# Patient Record
Sex: Male | Born: 1950 | Race: White | Marital: Married | Smoking: Never smoker
Health system: Southern US, Community
[De-identification: ages and names within clinical notes are randomized; demographics above are authoritative.]

## PROBLEM LIST (undated history)

## (undated) DIAGNOSIS — C801 Malignant (primary) neoplasm, unspecified: Secondary | ICD-10-CM

## (undated) DIAGNOSIS — R112 Nausea with vomiting, unspecified: Secondary | ICD-10-CM

## (undated) DIAGNOSIS — K219 Gastro-esophageal reflux disease without esophagitis: Secondary | ICD-10-CM

## (undated) DIAGNOSIS — Z86718 Personal history of other venous thrombosis and embolism: Secondary | ICD-10-CM

## (undated) DIAGNOSIS — I4892 Unspecified atrial flutter: Secondary | ICD-10-CM

## (undated) DIAGNOSIS — Z9889 Other specified postprocedural states: Secondary | ICD-10-CM

## (undated) DIAGNOSIS — Z86711 Personal history of pulmonary embolism: Secondary | ICD-10-CM

## (undated) DIAGNOSIS — I77810 Thoracic aortic ectasia: Secondary | ICD-10-CM

## (undated) DIAGNOSIS — R519 Headache, unspecified: Secondary | ICD-10-CM

## (undated) DIAGNOSIS — I480 Paroxysmal atrial fibrillation: Secondary | ICD-10-CM

## (undated) DIAGNOSIS — I712 Thoracic aortic aneurysm, without rupture: Secondary | ICD-10-CM

## (undated) DIAGNOSIS — I351 Nonrheumatic aortic (valve) insufficiency: Secondary | ICD-10-CM

## (undated) DIAGNOSIS — F329 Major depressive disorder, single episode, unspecified: Secondary | ICD-10-CM

## (undated) DIAGNOSIS — R51 Headache: Secondary | ICD-10-CM

## (undated) DIAGNOSIS — D6859 Other primary thrombophilia: Secondary | ICD-10-CM

## (undated) DIAGNOSIS — IMO0001 Reserved for inherently not codable concepts without codable children: Secondary | ICD-10-CM

## (undated) DIAGNOSIS — Z0389 Encounter for observation for other suspected diseases and conditions ruled out: Secondary | ICD-10-CM

## (undated) DIAGNOSIS — F32A Depression, unspecified: Secondary | ICD-10-CM

## (undated) DIAGNOSIS — M1990 Unspecified osteoarthritis, unspecified site: Secondary | ICD-10-CM

## (undated) HISTORY — PX: HAND SURGERY: SHX662

## (undated) HISTORY — DX: Personal history of pulmonary embolism: Z86.711

## (undated) HISTORY — DX: Thoracic aortic ectasia: I77.810

## (undated) HISTORY — PX: KNEE SURGERY: SHX244

## (undated) HISTORY — DX: Nonrheumatic aortic (valve) insufficiency: I35.1

## (undated) HISTORY — DX: Reserved for inherently not codable concepts without codable children: IMO0001

## (undated) HISTORY — PX: ELBOW SURGERY: SHX618

## (undated) HISTORY — PX: SHOULDER SURGERY: SHX246

## (undated) HISTORY — DX: Other primary thrombophilia: D68.59

## (undated) HISTORY — DX: Paroxysmal atrial fibrillation: I48.0

## (undated) HISTORY — DX: Unspecified osteoarthritis, unspecified site: M19.90

## (undated) HISTORY — DX: Personal history of other venous thrombosis and embolism: Z86.718

## (undated) HISTORY — DX: Thoracic aortic aneurysm, without rupture: I71.2

## (undated) HISTORY — DX: Encounter for observation for other suspected diseases and conditions ruled out: Z03.89

## (undated) HISTORY — DX: Unspecified atrial flutter: I48.92

---

## 2015-03-10 DIAGNOSIS — I7121 Aneurysm of the ascending aorta, without rupture: Secondary | ICD-10-CM

## 2015-03-10 DIAGNOSIS — I712 Thoracic aortic aneurysm, without rupture: Secondary | ICD-10-CM

## 2015-03-10 DIAGNOSIS — I77810 Thoracic aortic ectasia: Secondary | ICD-10-CM

## 2015-03-10 HISTORY — DX: Thoracic aortic ectasia: I77.810

## 2015-03-10 HISTORY — DX: Thoracic aortic aneurysm, without rupture: I71.2

## 2015-03-10 HISTORY — DX: Aneurysm of the ascending aorta, without rupture: I71.21

## 2016-01-14 ENCOUNTER — Encounter: Payer: Self-pay | Admitting: Surgery

## 2016-01-14 ENCOUNTER — Encounter: Payer: Self-pay | Admitting: *Deleted

## 2016-01-14 ENCOUNTER — Institutional Professional Consult (permissible substitution) (INDEPENDENT_AMBULATORY_CARE_PROVIDER_SITE_OTHER): Payer: Medicare Other | Admitting: Surgery

## 2016-01-14 VITALS — BP 133/70 | HR 79 | Resp 16 | Ht 75.0 in | Wt 233.0 lb

## 2016-01-14 DIAGNOSIS — Z86711 Personal history of pulmonary embolism: Secondary | ICD-10-CM | POA: Insufficient documentation

## 2016-01-14 DIAGNOSIS — I351 Nonrheumatic aortic (valve) insufficiency: Secondary | ICD-10-CM | POA: Diagnosis not present

## 2016-01-14 DIAGNOSIS — I712 Thoracic aortic aneurysm, without rupture, unspecified: Secondary | ICD-10-CM

## 2016-01-14 DIAGNOSIS — I77810 Thoracic aortic ectasia: Secondary | ICD-10-CM | POA: Diagnosis not present

## 2016-01-14 DIAGNOSIS — M1990 Unspecified osteoarthritis, unspecified site: Secondary | ICD-10-CM | POA: Insufficient documentation

## 2016-01-18 ENCOUNTER — Encounter: Payer: Self-pay | Admitting: Surgery

## 2016-01-18 NOTE — Progress Notes (Signed)
Cardiothoracic Surgery Consultation  PCP is Lynita Lombard, MD Referring Provider is Christene Slates. Denier, MD  Chief Complaint  Patient presents with  . TAA    and AORTIC REGURG. per ECHO    HPI:  The patient is a 65 year old gentleman who presented with a pulmonary embolism in October 2016 thought to be related to DVT from sleeping in a recliner with his legs hanging down because he developed marked leg edema that suddenly resolved on the right side about the time he suffered the PE. Venous studies reportedly never showed a DVT. He was started on Xarelto. He had a sister who had a factory II abnormality and multiple PE's. He was treated with Xarelto for 6 months and it was stopped. 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  He had a CT of the chest in August 2016 that showed an ascending aortic aneurysm with a maximum diameter of 4.4 cm at the level of the right PA. He also had an echo in September 2016 that showed a dilated aortic root with a sinus of valsalva diameter of 4.9 cm with moderate AI. A repeat echo on 10/28/2015 showed no change in the aorta with a sinus diameter of 4.9 cm, an STJ diameter of 4.8 cm and an ascending aorta diameter of 4.2 cm. There was a trileaflet aortic valve with  moderate AI with a PHT of 430 ms. The LVEF was 55-60% with normal LV internal dimensions.   Past Medical History  Diagnosis Date  . Thoracic ascending aortic aneurysm ([place]) 03/2015    PER CT CHEST  . Dilated aortic root ([place]) 03/2015    PER CT CHEST  . Aortic regurgitation 9/23,2016 10/28/2015    MODERATE PER ECHOES  . Arthritis     WITH SPINAL INJURY  . Personal history of PE (pulmonary embolism)     BILATERAL    History reviewed. No pertinent past surgical history.  History reviewed. No pertinent family history.  Social History Social History  Substance Use Topics  . Smoking status: Never Smoker   . Smokeless tobacco: None  . Alcohol Use: 0.0  oz/week    0 Standard drinks or equivalent per week     Comment: 3-4 BEERS/WEEK    Current Outpatient Prescriptions  Medication Sig Dispense Refill  . aspirin EC 81 MG tablet Take 81 mg by mouth daily.    . baclofen (LIORESAL) 10 MG tablet Take 20 mg by mouth 2 (two) times daily.     Marland Kitchen ibuprofen (ADVIL,MOTRIN) 200 MG tablet Take 400 mg by mouth every 8 (eight) hours as needed.    Marland Kitchen omeprazole (PRILOSEC) 20 MG capsule Take 20 mg by mouth daily.    . traMADol (ULTRAM) 50 MG tablet Take 100 mg by mouth every 12 (twelve) hours as needed.     . cyclobenzaprine (FLEXERIL) 10 MG tablet Take 10 mg by mouth daily.    Marland Kitchen gabapentin (NEURONTIN) 300 MG capsule Take 300 mg by mouth 3 (three) times daily.     No current facility-administered medications for this visit.    Not on File  Review of Systems  Constitutional: Negative.   HENT: Negative.   Eyes:       Wears glasses, has floaters  Respiratory: Negative.  Negative for shortness of breath.   Cardiovascular: Negative.  Negative for chest pain and leg swelling.  Gastrointestinal: Negative.   Endocrine: Negative.   Genitourinary: Negative.   Musculoskeletal: Positive for back  pain, joint swelling and arthralgias.  Allergic/Immunologic: Negative.   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       Factor II mutation  Psychiatric/Behavioral: Positive for dysphoric mood.    BP 133/70 mmHg  Pulse 79  Resp 16  Ht 6\' 3"  (1.905 m)  Wt 233 lb (105.688 kg)  BMI 29.12 kg/m2  SpO2 98% Physical Exam  Constitutional: He is oriented to person, place, and time. He appears well-developed and well-nourished. No distress.  HENT:  Head: Normocephalic and atraumatic.  Mouth/Throat: Oropharynx is clear and moist.  Eyes: EOM are normal. Pupils are equal, round, and reactive to light.  Neck: Normal range of motion. Neck supple. No thyromegaly present.  Cardiovascular: Normal rate, regular  rhythm and intact distal pulses.   Murmur heard. 1/6 diastolic murmur along the RSB  Pulmonary/Chest: Effort normal and breath sounds normal. No respiratory distress.  Abdominal: Soft. Bowel sounds are normal. He exhibits no distension and no mass. There is no tenderness.  Musculoskeletal: Normal range of motion. He exhibits no edema or tenderness.  Lymphadenopathy:    He has no cervical adenopathy.  Neurological: He is alert and oriented to person, place, and time. He has normal strength. No cranial nerve deficit.  Skin: Skin is warm and dry.  Psychiatric: He has a normal mood and affect.     Diagnostic Tests:  Echo report from [facility] reviewed. He has the study on a disc but I could not get it to run on our system even with IT help.   Impression:  He has an aortic root and ascending aortic aneurysm with a sinus diameter of 4.9 cm and an STJ of 4.8 cm with an ascending aorta of 4.2 cm. There is a trileaflet aortic valve with moderate AI and normal LV function and dimensions. There was no significant change from the echo in September 2016. There is no indication to do surgery at this time. It is unknown how long his aorta has been this size. Surgery to replace the aorta is usually recommended at 5.5 cm or if there is a period of rapid enlargement considered to be 5 mm over one year. His AI is likely related to the aortic root aneurysm and will need to be followed since it may worsen over time. Indications for surgery would be severe symptomatic AI or LV dilation or reduction in LVEF. I reviewed the echo findings with him and his wife. We discussed the follow up needed and the indications for surgery. All of their questions were answered.  Plan:  I will see him back in 6 months for a CTA of the chest and will then plan to follow him at intervals to assess for enlargement of the aorta. He will continue to follow up with his cardiologist and will need periodic  echocardiograms to follow the aortic insufficiency. Gaye Pollack, MD [facility] [phone]

## 2016-06-14 ENCOUNTER — Other Ambulatory Visit: Payer: Self-pay | Admitting: Surgery

## 2016-06-14 DIAGNOSIS — I712 Thoracic aortic aneurysm, without rupture, unspecified: Secondary | ICD-10-CM

## 2016-07-21 ENCOUNTER — Encounter: Payer: Self-pay | Admitting: Surgery

## 2016-07-21 ENCOUNTER — Ambulatory Visit (INDEPENDENT_AMBULATORY_CARE_PROVIDER_SITE_OTHER): Payer: Medicare Other | Admitting: Surgery

## 2016-07-21 ENCOUNTER — Ambulatory Visit
Admission: RE | Admit: 2016-07-21 | Discharge: 2016-07-21 | Disposition: A | Discharge status: Home / Self Care | Payer: Medicare Other | Source: Ambulatory Visit | Attending: Surgery | Admitting: Surgery

## 2016-07-21 VITALS — BP 127/75 | HR 70 | Resp 16 | Ht 76.0 in | Wt 191.0 lb

## 2016-07-21 DIAGNOSIS — I712 Thoracic aortic aneurysm, without rupture, unspecified: Secondary | ICD-10-CM

## 2016-07-21 DIAGNOSIS — I351 Nonrheumatic aortic (valve) insufficiency: Secondary | ICD-10-CM

## 2016-07-21 DIAGNOSIS — I77810 Thoracic aortic ectasia: Secondary | ICD-10-CM | POA: Diagnosis not present

## 2016-07-21 MED ORDER — IOPAMIDOL (ISOVUE-370) INJECTION 76%: 75.0000 mL | Freq: Once | INTRAVENOUS | Status: DC | PRN

## 2016-07-22 ENCOUNTER — Other Ambulatory Visit: Payer: Self-pay | Admitting: *Deleted

## 2016-07-22 DIAGNOSIS — I351 Nonrheumatic aortic (valve) insufficiency: Secondary | ICD-10-CM

## 2016-07-22 DIAGNOSIS — I712 Thoracic aortic aneurysm, without rupture, unspecified: Secondary | ICD-10-CM

## 2016-07-23 ENCOUNTER — Telehealth: Payer: Self-pay | Admitting: Cardiovascular Disease

## 2016-07-23 NOTE — Telephone Encounter (Signed)
I spoke with pt and  scheduled him to see Dr. Angelena Form on 07/28/16 at 11:30

## 2016-07-23 NOTE — Telephone Encounter (Signed)
Pt returned this office call and would like an appt 10.- 2:30pm  eailer would be better if possible.    Please  Please give him a call back.

## 2016-07-25 ENCOUNTER — Encounter: Payer: Self-pay | Admitting: Surgery

## 2016-07-25 NOTE — Progress Notes (Signed)
HPI:  The patient returns today for follow up of an aortic root aneurysm with a sinus diameter of 4.9 cm, an STJ of 4.8 cm and an ascending diameter of 4.2 cm by echo done at [facility] in March 2017. He had moderate AI at that time with a PHT of 430 ms with a normal LV.  A prior CT of the chest in August 2016 had shown an ascending aortic aneurysm with a diameter of 4.4 cm at the level of the right PA. Since I saw him in June he has been doing well. He has had no chest pain or shortness of breath. He has lost a lot of weight by eating a strict, mostly vegetable diet and not eating any sweets. His main complaints are related to DJD of the spine and arthritis and not being able to take NSAIDS due to being on Xarelto. He had a rhythm monitor on for a week in October and had a few brief runs of atrial flutter and atrial fibrillation and was restarted on Xarelto when he saw his cardiologist, Dr. Boyd Kerbs, on Nov 1. 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 He had a 2D echo on 04/29/2016 that showed an aortic sinus diameter of 5.0 cm with an STJ of 3.7 cm and ascending aortic diameter of 4.3 cm. LV internal dimensions were within the normal range. EF was 50-55% with mild LVH and stage 1 diastolic dysfuction. The aortic valve was trileaflet with severe AI. The PHT was recorded as 435 ms. There was no MR. He had a nuclear stress test on 06/07/2016 that was negative for ischemia with an EF of 55%.  Current Outpatient Prescriptions  Medication Sig Dispense Refill  . acetaminophen (TYLENOL) 500 MG tablet Take 500 mg by mouth 3 (three) times daily.    . baclofen (LIORESAL) 10 MG tablet Take 10 mg by mouth 2 (two) times daily.     . cyclobenzaprine (FLEXERIL) 10 MG tablet Take 10 mg by mouth daily.    . fluticasone (FLONASE) 50 MCG/ACT nasal spray Place 2 sprays into both nostrils daily.    Marland Kitchen gabapentin  (NEURONTIN) 300 MG capsule Take 300 mg by mouth 3 (three) times daily.    . metoprolol tartrate (LOPRESSOR) 25 MG tablet Take 25 mg by mouth 2 (two) times daily.    Marland Kitchen omeprazole (PRILOSEC) 20 MG capsule Take 20 mg by mouth daily.    . rivaroxaban (XARELTO) 20 MG TABS tablet Take 20 mg by mouth daily with supper.    . traMADol (ULTRAM) 50 MG tablet Take 100 mg by mouth every 12 (twelve) hours as needed.      No current facility-administered medications for this visit.      Physical Exam: BP 127/75 (BP Location: Right Arm, Patient Position: Sitting, Cuff Size: Large)   Pulse 70   Resp 16   Ht 6\' 4"  (1.93 m)   Wt 191 lb (86.6 kg)   SpO2 99% Comment: ON RA  BMI 23.25 kg/m  He looks well but much thinner than when I last saw him. Cardiac exam shows a regular rate and rhythm with a 2/6 diastolic murmur along the RSB.  Lungs are clear There is no peripheral edema.   Diagnostic Exam:  CLINICAL DATA:  Thoracic aortic aneurysm without rupture.  EXAM: CT ANGIOGRAPHY CHEST WITH CONTRAST  TECHNIQUE: Multidetector CT imaging of  the chest was performed using the standard protocol during bolus administration of intravenous contrast. Multiplanar CT image reconstructions and MIPs were obtained to evaluate the vascular anatomy.  CONTRAST:  75 mL of Isovue 370 intravenously.  COMPARISON:  None.  FINDINGS: Cardiovascular: There is no evidence of thoracic aortic dissection. 4.1 cm ascending thoracic aortic aneurysm is noted. Aortic root is aneurysmal at 5.4 cm. Transverse aortic arch measures 3.2 cm. Proximal descending thoracic aorta measures 3.7 cm. Distal descending thoracic aorta measures 2.6 cm. Great vessels are widely patent without significant stenosis.  Mediastinum/Nodes: No enlarged mediastinal, hilar, or axillary lymph nodes. Thyroid gland, trachea, and esophagus demonstrate no significant findings.  Lungs/Pleura: Lungs are clear. No pleural effusion or  pneumothorax.  Upper Abdomen: No acute abnormality.  Musculoskeletal: No chest wall abnormality. No acute or significant osseous findings.  Review of the MIP images confirms the above findings.  IMPRESSION: Aortic root is aneurysmal at 5.4 cm. 4.1 cm ascending thoracic aortic aneurysm is noted. Recommend annual imaging followup by CTA or MRA. This recommendation follows 2010 ACCF/AHA/AATS/ACR/ASA/SCA/SCAI/SIR/STS/SVM Guidelines for the Diagnosis and Management of Patients with Thoracic Aortic Disease. Circulation. 2010; [id number].   Electronically Signed   By: Marijo Conception, M.D.   On: 07/21/2016 13:24   Impression:  Mr. K[place]ik has an aortic root and ascending aortic aneurysm with further enlargement of the sinus diameter on his CTA to 5.4 cm. He has moderate to severe AI by echo with a diastolic murmur on exam. I think this aneurysm and AI needs to be repaired to prevent an aortic dissection and progressive deterioration of LV function due to severe AI. I would plan to replace the aortic root and ascending aorta up to the innominate artery which is where the aorta decreases to more normal size of 3.2 cm. I don't think his aortic valve will be suitable for a valve-sparing root replacement given the size of the annulus and the degree of AI. The leaflets are usually elongated and fenestrated at the commissures and  long term valve competency is reduced. I discussed use of mechanical and bioprosthetic valves with him and his wife. He would need to be on life-long coumadin with a mechanical valve which he is not interested in. Given his age of 54 a bovine pericardial valve would be a good choice with a low risk of structural valve deterioration in his lifetime and no need for coumadin. He is in agreement with that. He is currently on Xarelto for possible brief episodes of atrial fibrillation and atrial flutter. If he has a prosthetic valve in place then Xarelto is not approved for  valvular atrial fibrillation at this time and he may need to be on Coumadin anyway, although there is no absolute indication for it as far as the valve is concerned. I would plan to place a clip on the left atrial appendage to decrease the risk of left atrial thrombus and that may allow him to avoid anticoagulation. I discussed the operative procedure with the patient and his wife including alternatives, benefits and risks; including but not limited to bleeding, blood transfusion, infection, stroke, myocardial infarction, structural valve deterioration, heart block requiring a permanent pacemaker, organ dysfunction, and death.  Kriste Basque understands and agrees to proceed. He will require a cardiac cath prior to surgery and would like to have that done at [facility] since his daughter-in-law, Vivien Stephan, is a PA for radiology here. We will arrange that as an outpatient in early January and I  would like to wait a week afterwards to do surgery. He will stop the Xarelto 5 days prior to cath and will stay off of it up to the time of surgery.   Plan:  We will schedule cardiac cath as an outpatient in January a week prior to surgery. He will think about the timing of surgery and call my nurse to schedule Bioprosthetic Bentall procedure with replacement of the ascending aortic aneurysm using circulatory arrest, clipping of the left atrial appendage.   Gaye Pollack, MD [facility] [phone]

## 2016-07-27 NOTE — Progress Notes (Signed)
Chief Complaint  Patient presents with  . New Patient (Initial Visit)    heart cath eval    History of Present Illness: 65 yo male with history of PE/DVT on Xarelto, Factor II mutation, ascending aorta aneurysm, moderate aortic insufficiency, paroxysmal atrial fibrillation/flutter who is here today as a new patient to discuss his upcoming cardiac catheterization. He has been seen in our CT surgery office by Dr. Cyndia Bent. Most recent echo September 2017 with normal LV systolic function, severe aortic valve insufficiency. Nuclear stress test October 2017 with no ischemia. His cardiologist is Dr. Fuller Plan  in [place], [place]. Dr. Mellody Memos is his cardiologist. Dr. Cyndia Bent is planning a Bentall procedure with replacement of the ascending aortic aneurysm, AV replacement and clipping of the left atrial appendage.   He has no complaints today. He has many questions about his cardiac cath. No chest pain or dyspnea. No LE edema.   Primary Care Physician: No primary care provider on file.  Dr. Fuller Plan [place], [place]) Cardiology: Dr. Mellody Memos [place])   Past Medical History:  Diagnosis Date  . Aortic regurgitation 9/23,2016 10/28/2015   MODERATE PER ECHOES  . Arthritis    WITH SPINAL INJURY  . Dilated aortic root ([place]) 03/2015   PER CT CHEST  . Personal history of PE (pulmonary embolism)    BILATERAL  . Thoracic ascending aortic aneurysm ([place]) 03/2015   PER CT CHEST    Past Surgical History:  Procedure Laterality Date  . ELBOW SURGERY    . HAND SURGERY    . KNEE SURGERY    . SHOULDER SURGERY      Current Outpatient Prescriptions  Medication Sig Dispense Refill  . acetaminophen (TYLENOL) 500 MG tablet Take 500 mg by mouth 3 (three) times daily.    . baclofen (LIORESAL) 10 MG tablet Take 10 mg by mouth 2 (two) times daily.     . cyclobenzaprine (FLEXERIL) 10 MG tablet Take 10 mg by mouth daily.    . fluticasone (FLONASE) 50 MCG/ACT nasal spray Place 2 sprays into both nostrils daily.    Marland Kitchen gabapentin  (NEURONTIN) 300 MG capsule Take 300 mg by mouth 3 (three) times daily.    . metoprolol tartrate (LOPRESSOR) 25 MG tablet Take 25 mg by mouth 2 (two) times daily.    Marland Kitchen omeprazole (PRILOSEC) 20 MG capsule Take 20 mg by mouth daily.    . rivaroxaban (XARELTO) 20 MG TABS tablet Take 20 mg by mouth daily with supper.    . traMADol (ULTRAM) 50 MG tablet Take 100 mg by mouth every 12 (twelve) hours as needed.      No current facility-administered medications for this visit.     Allergies:surgical tape  Social History   Social History  . Marital status: Unknown    Spouse name: N/A  . Number of children: N/A  . Years of education: N/A   Occupational History  . Not on file.   Social History Main Topics  . Smoking status: Never Smoker  . Smokeless tobacco: Never Used  . Alcohol use 0.0 oz/week     Comment: 3-4 BEERS/WEEK  . Drug use: No  . Sexual activity: Not on file   Other Topics Concern  . Not on file   Social History Narrative  . No narrative on file    Family History  Problem Relation Age of Onset  . CVA Father   . Deep vein thrombosis Sister     Review of Systems:  As stated in the HPI and  otherwise negative.   BP 122/60 (BP Location: Left Arm, Cuff Size: Normal)   Pulse (!) 59   Ht 6\' 4"  (1.93 m)   Wt 195 lb 12.8 oz (88.8 kg)   BMI 23.83 kg/m   Physical Examination: General: Well developed, well nourished, NAD  HEENT: OP clear, mucus membranes moist  SKIN: warm, dry. No rashes. Neuro: No focal deficits  Musculoskeletal: Muscle strength 5/5 all ext  Psychiatric: Mood and affect normal  Neck: No JVD, no carotid bruits, no thyromegaly, no lymphadenopathy.  Lungs:Clear bilaterally, no wheezes, rhonci, crackles Cardiovascular: Regular rate and rhythm. No murmurs, gallops or rubs. Abdomen:Soft. Bowel sounds present. Non-tender.  Extremities: No lower extremity edema. Pulses are 2 + in the bilateral DP/PT.  EKG:  EKG is ordered today. The ekg ordered today  demonstrates Sinus brady, rate 59 bpm. 1st degree AV block. LVH.    Wt Readings from Last 3 Encounters:  07/28/16 195 lb 12.8 oz (88.8 kg)  07/21/16 191 lb (86.6 kg)  01/14/16 233 lb (105.7 kg)     Other studies Reviewed: Additional studies/ records that were reviewed today include: . Review of the above records demonstrates:   Assessment and Plan:   1. Thoracic aortic aneurysm/Aortic insufficiency: Planning underway per DR. Bartle for Bentall procedure 08/20/15 at [facility]. He will need a right and left heart catheterization prior to his planned surgical procedure. I have reviewed the risks of the cardiac catheterization today. He is willing to proceed. Pre-cath labs today. Will arrange right and left cardiac cath at [facility] on 08/12/15 at 11:30am.   Current medicines are reviewed at length with the patient today.  The patient does not have concerns regarding medicines.  The following changes have been made:  no change  Labs/ tests ordered today include:   Orders Placed This Encounter  Procedures  . CBC  . INR/PT  . Basic Metabolic Panel (BMET)  . EKG 12-Lead    Disposition:   No follow up after cath in this office.    Signed, Lauree Chandler, MD 07/28/2016 1:43 PM    [facility] [place], [place], [place]  [address] Phone: [phone]; Fax: [phone]

## 2016-07-28 ENCOUNTER — Encounter: Payer: Self-pay | Admitting: Cardiovascular Disease

## 2016-07-28 ENCOUNTER — Encounter: Payer: Self-pay | Admitting: *Deleted

## 2016-07-28 ENCOUNTER — Ambulatory Visit (INDEPENDENT_AMBULATORY_CARE_PROVIDER_SITE_OTHER): Payer: Medicare Other | Admitting: Cardiovascular Disease

## 2016-07-28 VITALS — BP 122/60 | HR 59 | Ht 76.0 in | Wt 195.8 lb

## 2016-07-28 DIAGNOSIS — I351 Nonrheumatic aortic (valve) insufficiency: Secondary | ICD-10-CM | POA: Diagnosis not present

## 2016-07-28 DIAGNOSIS — I712 Thoracic aortic aneurysm, without rupture, unspecified: Secondary | ICD-10-CM

## 2016-07-28 LAB — BASIC METABOLIC PANEL
BUN: 13 mg/dL (ref 7–25)
CO2: 25 mmol/L (ref 20–31)
Calcium: 9.7 mg/dL (ref 8.6–10.3)
Chloride: 108 mmol/L (ref 98–110)
Creat: 0.91 mg/dL (ref 0.70–1.25)
GLUCOSE: 83 mg/dL (ref 65–99)
Potassium: 4.4 mmol/L (ref 3.5–5.3)
SODIUM: 142 mmol/L (ref 135–146)

## 2016-07-28 LAB — CBC
HEMATOCRIT: 44.2 % (ref 38.5–50.0)
Hemoglobin: 14.7 g/dL (ref 13.2–17.1)
MCH: 30.4 pg (ref 27.0–33.0)
MCHC: 33.3 g/dL (ref 32.0–36.0)
MCV: 91.3 fL (ref 80.0–100.0)
MPV: 11.6 fL (ref 7.5–12.5)
Platelets: 191 10*3/uL (ref 140–400)
RBC: 4.84 MIL/uL (ref 4.20–5.80)
RDW: 15.3 % — AB (ref 11.0–15.0)
WBC: 5.6 10*3/uL (ref 3.8–10.8)

## 2016-07-28 LAB — PROTIME-INR
INR: 1.3 — ABNORMAL HIGH
Prothrombin Time: 13.2 s — ABNORMAL HIGH (ref 9.0–11.5)

## 2016-07-28 NOTE — Patient Instructions (Signed)
Medication Instructions:  Your physician recommends that you continue on your current medications as directed. Please refer to the Current Medication list given to you today.   Labwork: Lab work to be done today--BMP, CBC, PT/INR  Testing/Procedures: Your physician has requested that you have a cardiac catheterization. Cardiac catheterization is used to diagnose and/or treat various heart conditions. Doctors may recommend this procedure for a number of different reasons. The most common reason is to evaluate chest pain. Chest pain can be a symptom of coronary artery disease (CAD), and cardiac catheterization can show whether plaque is narrowing or blocking your heart's arteries. This procedure is also used to evaluate the valves, as well as measure the blood flow and oxygen levels in different parts of your heart. For further information please visit [email or web address]. Please follow instruction sheet, as given. Scheduled for January 3,2018 Coronary Angiogram A coronary angiogram is an X-ray procedure that is used to examine the arteries in the heart. In this procedure, a dye (contrast dye) is injected through a long, thin tube (catheter). The catheter is inserted through the groin, wrist, or arm. The dye is injected into each artery, then X-rays are taken to show if there is a blockage in the arteries of the heart. This procedure can also show if you have valve disease or a disease of the aorta, and it can be used to check the overall function of your heart muscle. You may have a coronary angiogram if:  You are having chest pain, or other symptoms of angina, and you are at risk for heart disease.  You have an abnormal electrocardiogram (ECG) or stress test.  You have chest pain and heart failure.  You are having irregular heart rhythms.  You and your health care provider determine that the benefits of the test information outweigh the risks of the procedure. Let your health care provider know  about:  Any allergies you have, including allergies to contrast dye.  All medicines you are taking, including vitamins, herbs, eye drops, creams, and over-the-counter medicines.  Any problems you or family members have had with anesthetic medicines.  Any blood disorders you have.  Any surgeries you have had.  History of kidney problems or kidney failure.  Any medical conditions you have.  Whether you are pregnant or may be pregnant. What are the risks? Generally, this is a safe procedure. However, problems may occur, including:  Infection.  Allergic reaction to medicines or dyes that are used.  Bleeding from the access site or other locations.  Kidney injury, especially in people with impaired kidney function.  Stroke (rare).  Heart attack (rare).  Damage to other structures or organs. What happens before the procedure? Staying hydrated  Follow instructions from your health care provider about hydration, which may include:  Up to 2 hours before the procedure - you may continue to drink clear liquids, such as water, clear fruit juice, black coffee, and plain tea. Eating and drinking restrictions  Follow instructions from your health care provider about eating and drinking, which may include:  8 hours before the procedure - stop eating heavy meals or foods such as meat, fried foods, or fatty foods.  6 hours before the procedure - stop eating light meals or foods, such as toast or cereal.  2 hours before the procedure - stop drinking clear liquids. General instructions  Ask your health care provider about:  Changing or stopping your regular medicines. This is especially important if you are taking diabetes medicines or blood  thinners.  Taking medicines such as ibuprofen. These medicines can thin your blood. Do not take these medicines before your procedure if your health care provider instructs you not to, though aspirin may be recommended prior to coronary  angiograms.  Plan to have someone take you home from the hospital or clinic.  You may need to have blood tests or X-rays done. What happens during the procedure?  An IV tube will be inserted into one of your veins.  You will be given one or more of the following:  A medicine to help you relax (sedative).  A medicine to numb the area where the catheter will be inserted into an artery (local anesthetic).  To reduce your risk of infection:  Your health care team will wash or sanitize their hands.  Your skin will be washed with soap.  Hair may be removed from the area where the catheter will be inserted.  You will be connected to a continuous ECG monitor.  The catheter will be inserted into an artery. The location may be in your groin, in your wrist, or in the fold of your arm (near your elbow).  A type of X-ray (fluoroscopy) will be used to help guide the catheter to the opening of the blood vessel that is being examined.  A dye will be injected into the catheter, and X-rays will be taken. The dye will help to show where any narrowing or blockages are located in the heart arteries.  Tell your health care provider if you have any chest pain or trouble breathing during the procedure.  If blockages are found, your health care provider may perform another procedure, such as inserting a coronary stent. The procedure may vary among health care providers and hospitals. What happens after the procedure?  After the procedure, you will need to keep the area still for a few hours, or for as long as told by your health care provider. If the procedure is done through the groin, you will be instructed to not bend and not cross your legs.  The insertion site will be checked frequently.  The pulse in your foot or wrist will be checked frequently.  You may have additional blood tests, X-rays, and a test that records the electrical activity of your heart (ECG).  Do not drive for 24 hours if  you were given a sedative. Summary  A coronary angiogram is an X-ray procedure that is used to look into the arteries in the heart.  During the procedure, a dye (contrast dye) is injected through a long, thin tube (catheter). The catheter is inserted through the groin, wrist, or arm.  Tell your health care provider about any allergies you have, including allergies to contrast dye.  After the procedure, you will need to keep the area still for a few hours, or for as long as told by your health care provider. This information is not intended to replace advice given to you by your health care provider. Make sure you discuss any questions you have with your health care provider. Document Released: 01/30/2003 Document Revised: 05/07/2016 Document Reviewed: 05/07/2016 Elsevier Interactive Patient Education  2017 [place]:   Any Other Special Instructions Will Be Listed Below (If Applicable).     If you need a refill on your cardiac medications before your next appointment, please call your pharmacy.

## 2016-07-29 ENCOUNTER — Telehealth: Payer: Self-pay | Admitting: Cardiovascular Disease

## 2016-07-29 NOTE — Telephone Encounter (Signed)
Clinton Pearson is calling to inform you that he has spine damage and has trouble laying flat on his back . He is to have a cath on 08/11/16. Please call . Thanks

## 2016-07-29 NOTE — Telephone Encounter (Signed)
Spoke with patient and he is concerned about having to lay flat on his back for his heart cath. Patient said he could lay back some but not completely flat. Nurse advised patient that he would be sedated for the cath which would help with any discomfort. Patient advised that his provider would be notified of his concerns and he would be contacted with a a response afterwards. Patient is aware that he may not hear back from our office until the first week of January 2018.

## 2016-08-05 NOTE — Telephone Encounter (Signed)
Pat, Can you let him know that we can sedate him for the cath and this should be ok. Thanks, chris

## 2016-08-05 NOTE — Telephone Encounter (Signed)
I spoke with pt and gave him information from Dr. McAlhany 

## 2016-08-11 ENCOUNTER — Ambulatory Visit (HOSPITAL_COMMUNITY)
Admission: RE | Admit: 2016-08-11 | Discharge: 2016-08-11 | Disposition: A | Discharge status: Home / Self Care | Payer: Medicare Other | Source: Ambulatory Visit | Attending: Cardiovascular Disease | Admitting: Cardiovascular Disease

## 2016-08-11 ENCOUNTER — Encounter (HOSPITAL_COMMUNITY)
Admission: RE | Disposition: A | Discharge status: Home / Self Care | Payer: Self-pay | Source: Ambulatory Visit | Attending: Cardiovascular Disease

## 2016-08-11 ENCOUNTER — Encounter (HOSPITAL_COMMUNITY): Payer: Self-pay | Admitting: Cardiovascular Disease

## 2016-08-11 DIAGNOSIS — I4892 Unspecified atrial flutter: Secondary | ICD-10-CM | POA: Insufficient documentation

## 2016-08-11 DIAGNOSIS — Z79899 Other long term (current) drug therapy: Secondary | ICD-10-CM | POA: Insufficient documentation

## 2016-08-11 DIAGNOSIS — I82509 Chronic embolism and thrombosis of unspecified deep veins of unspecified lower extremity: Secondary | ICD-10-CM | POA: Insufficient documentation

## 2016-08-11 DIAGNOSIS — I2782 Chronic pulmonary embolism: Secondary | ICD-10-CM | POA: Insufficient documentation

## 2016-08-11 DIAGNOSIS — Z7901 Long term (current) use of anticoagulants: Secondary | ICD-10-CM | POA: Diagnosis not present

## 2016-08-11 DIAGNOSIS — I712 Thoracic aortic aneurysm, without rupture, unspecified: Secondary | ICD-10-CM

## 2016-08-11 DIAGNOSIS — I480 Paroxysmal atrial fibrillation: Secondary | ICD-10-CM | POA: Insufficient documentation

## 2016-08-11 DIAGNOSIS — I351 Nonrheumatic aortic (valve) insufficiency: Secondary | ICD-10-CM | POA: Diagnosis not present

## 2016-08-11 HISTORY — PX: CARDIAC CATHETERIZATION: SHX172

## 2016-08-11 SURGERY — RIGHT/LEFT HEART CATH AND CORONARY ANGIOGRAPHY

## 2016-08-11 MED ORDER — SODIUM CHLORIDE 0.9% FLUSH: 3.0000 mL | INTRAVENOUS | Status: DC | PRN

## 2016-08-11 MED ORDER — LIDOCAINE HCL (PF) 1 % IJ SOLN
INTRAMUSCULAR | Status: DC | PRN
  Administered 2016-08-11 (×2): 2 mL

## 2016-08-11 MED ORDER — SODIUM CHLORIDE 0.9% FLUSH
3.0000 mL | Freq: Two times a day (BID) | INTRAVENOUS | Status: DC
Start: 2016-08-11 — End: 2016-08-11

## 2016-08-11 MED ORDER — MIDAZOLAM HCL 2 MG/2ML IJ SOLN
INTRAMUSCULAR | Status: AC
  Filled 2016-08-11: qty 2

## 2016-08-11 MED ORDER — ASPIRIN 81 MG PO CHEW: 81.0000 mg | CHEWABLE_TABLET | ORAL | Status: DC

## 2016-08-11 MED ORDER — ASPIRIN 81 MG PO CHEW
CHEWABLE_TABLET | ORAL | Status: DC | PRN
  Administered 2016-08-11: 324 mg via ORAL

## 2016-08-11 MED ORDER — SODIUM CHLORIDE 0.9 % IV SOLN: 250.0000 mL | INTRAVENOUS | Status: DC | PRN

## 2016-08-11 MED ORDER — FENTANYL CITRATE (PF) 100 MCG/2ML IJ SOLN
INTRAMUSCULAR | Status: AC
  Filled 2016-08-11: qty 2

## 2016-08-11 MED ORDER — SODIUM CHLORIDE 0.9 % IV SOLN
INTRAVENOUS | Status: AC
  Administered 2016-08-11: 10:00:00 via INTRAVENOUS

## 2016-08-11 MED ORDER — ASPIRIN 81 MG PO CHEW
CHEWABLE_TABLET | ORAL | Status: AC
  Filled 2016-08-11: qty 4

## 2016-08-11 MED ORDER — LIDOCAINE HCL (PF) 1 % IJ SOLN
INTRAMUSCULAR | Status: AC
  Filled 2016-08-11: qty 30

## 2016-08-11 MED ORDER — MIDAZOLAM HCL 2 MG/2ML IJ SOLN
INTRAMUSCULAR | Status: DC | PRN
  Administered 2016-08-11: 2 mg via INTRAVENOUS

## 2016-08-11 MED ORDER — FENTANYL CITRATE (PF) 100 MCG/2ML IJ SOLN
INTRAMUSCULAR | Status: DC | PRN
  Administered 2016-08-11 (×2): 50 ug via INTRAVENOUS

## 2016-08-11 MED ORDER — SODIUM CHLORIDE 0.9% FLUSH: 3.0000 mL | Freq: Two times a day (BID) | INTRAVENOUS | Status: DC

## 2016-08-11 MED ORDER — VERAPAMIL HCL 2.5 MG/ML IV SOLN
INTRAVENOUS | Status: AC
  Filled 2016-08-11: qty 2

## 2016-08-11 MED ORDER — HEPARIN (PORCINE) IN NACL 2-0.9 UNIT/ML-% IJ SOLN
INTRAMUSCULAR | Status: AC
  Filled 2016-08-11: qty 1000

## 2016-08-11 MED ORDER — HEPARIN SODIUM (PORCINE) 1000 UNIT/ML IJ SOLN
INTRAMUSCULAR | Status: AC
Start: 2016-08-11 — End: 2016-08-11
  Filled 2016-08-11: qty 1

## 2016-08-11 MED ORDER — HEPARIN SODIUM (PORCINE) 1000 UNIT/ML IJ SOLN
INTRAMUSCULAR | Status: DC | PRN
  Administered 2016-08-11: 4500 [IU] via INTRAVENOUS

## 2016-08-11 MED ORDER — VERAPAMIL HCL 2.5 MG/ML IV SOLN
INTRAVENOUS | Status: DC | PRN
  Administered 2016-08-11: 10 mL via INTRA_ARTERIAL

## 2016-08-11 MED ORDER — ASPIRIN 81 MG PO CHEW
CHEWABLE_TABLET | ORAL | Status: AC
  Filled 2016-08-11: qty 1

## 2016-08-11 MED ORDER — HEPARIN (PORCINE) IN NACL 2-0.9 UNIT/ML-% IJ SOLN
INTRAMUSCULAR | Status: DC | PRN
  Administered 2016-08-11: 1000 mL

## 2016-08-11 MED ORDER — IOPAMIDOL (ISOVUE-370) INJECTION 76%
INTRAVENOUS | Status: DC | PRN
  Administered 2016-08-11: 60 mL via INTRA_ARTERIAL

## 2016-08-11 MED ORDER — SODIUM CHLORIDE 0.9 % IV SOLN: INTRAVENOUS | Status: AC

## 2016-08-11 SURGICAL SUPPLY — 15 items
CATH BALLN WEDGE 5F 110CM (CATHETERS) ×2 IMPLANT
CATH INFINITI 5 FR JL3.5 (CATHETERS) ×2 IMPLANT
CATH INFINITI JR4 5F (CATHETERS) ×2 IMPLANT
DEVICE RAD COMP TR BAND LRG (VASCULAR PRODUCTS) ×2 IMPLANT
GLIDESHEATH SLEND SS 6F .021 (SHEATH) ×2 IMPLANT
GUIDEWIRE .025 260CM (WIRE) ×2 IMPLANT
GUIDEWIRE ANGLED .035X260CM (WIRE) IMPLANT
GUIDEWIRE INQWIRE 1.5J.035X260 (WIRE) IMPLANT
INQWIRE 1.5J .035X260CM (WIRE) ×3
KIT HEART LEFT (KITS) ×3 IMPLANT
PACK CARDIAC CATHETERIZATION (CUSTOM PROCEDURE TRAY) ×3 IMPLANT
SHEATH FAST CATH BRACH 5F 5CM (SHEATH) ×2 IMPLANT
SYR MEDRAD MARK V 150ML (SYRINGE) ×3 IMPLANT
TRANSDUCER W/STOPCOCK (MISCELLANEOUS) ×4 IMPLANT
TUBING CIL FLEX 10 FLL-RA (TUBING) ×3 IMPLANT

## 2016-08-11 NOTE — Discharge Instructions (Signed)

## 2016-08-11 NOTE — H&P (View-Only) (Signed)
Chief Complaint  Patient presents with  . New Patient (Initial Visit)    heart cath eval    History of Present Illness: 66 yo male with history of PE/DVT on Xarelto, Factor II mutation, ascending aorta aneurysm, moderate aortic insufficiency, paroxysmal atrial fibrillation/flutter who is here today as a new patient to discuss his upcoming cardiac catheterization. He has been seen in our CT surgery office by Dr. Cyndia Bent. Most recent echo September 2017 with normal LV systolic function, severe aortic valve insufficiency. Nuclear stress test October 2017 with no ischemia. His cardiologist is Dr. Fuller Plan  in [place], [place]. Dr. Mellody Memos is his cardiologist. Dr. Cyndia Bent is planning a Bentall procedure with replacement of the ascending aortic aneurysm, AV replacement and clipping of the left atrial appendage.   He has no complaints today. He has many questions about his cardiac cath. No chest pain or dyspnea. No LE edema.   Primary Care Physician: No primary care provider on file.  Dr. Fuller Plan [place], [place]) Cardiology: Dr. Mellody Memos [place])   Past Medical History:  Diagnosis Date  . Aortic regurgitation 9/23,2016 10/28/2015   MODERATE PER ECHOES  . Arthritis    WITH SPINAL INJURY  . Dilated aortic root ([place]) 03/2015   PER CT CHEST  . Personal history of PE (pulmonary embolism)    BILATERAL  . Thoracic ascending aortic aneurysm ([place]) 03/2015   PER CT CHEST    Past Surgical History:  Procedure Laterality Date  . ELBOW SURGERY    . HAND SURGERY    . KNEE SURGERY    . SHOULDER SURGERY      Current Outpatient Prescriptions  Medication Sig Dispense Refill  . acetaminophen (TYLENOL) 500 MG tablet Take 500 mg by mouth 3 (three) times daily.    . baclofen (LIORESAL) 10 MG tablet Take 10 mg by mouth 2 (two) times daily.     . cyclobenzaprine (FLEXERIL) 10 MG tablet Take 10 mg by mouth daily.    . fluticasone (FLONASE) 50 MCG/ACT nasal spray Place 2 sprays into both nostrils daily.    Marland Kitchen gabapentin  (NEURONTIN) 300 MG capsule Take 300 mg by mouth 3 (three) times daily.    . metoprolol tartrate (LOPRESSOR) 25 MG tablet Take 25 mg by mouth 2 (two) times daily.    Marland Kitchen omeprazole (PRILOSEC) 20 MG capsule Take 20 mg by mouth daily.    . rivaroxaban (XARELTO) 20 MG TABS tablet Take 20 mg by mouth daily with supper.    . traMADol (ULTRAM) 50 MG tablet Take 100 mg by mouth every 12 (twelve) hours as needed.      No current facility-administered medications for this visit.     Allergies:surgical tape  Social History   Social History  . Marital status: Unknown    Spouse name: N/A  . Number of children: N/A  . Years of education: N/A   Occupational History  . Not on file.   Social History Main Topics  . Smoking status: Never Smoker  . Smokeless tobacco: Never Used  . Alcohol use 0.0 oz/week     Comment: 3-4 BEERS/WEEK  . Drug use: No  . Sexual activity: Not on file   Other Topics Concern  . Not on file   Social History Narrative  . No narrative on file    Family History  Problem Relation Age of Onset  . CVA Father   . Deep vein thrombosis Sister     Review of Systems:  As stated in the HPI and  otherwise negative.   BP 122/60 (BP Location: Left Arm, Cuff Size: Normal)   Pulse (!) 59   Ht 6\' 4"  (1.93 m)   Wt 195 lb 12.8 oz (88.8 kg)   BMI 23.83 kg/m   Physical Examination: General: Well developed, well nourished, NAD  HEENT: OP clear, mucus membranes moist  SKIN: warm, dry. No rashes. Neuro: No focal deficits  Musculoskeletal: Muscle strength 5/5 all ext  Psychiatric: Mood and affect normal  Neck: No JVD, no carotid bruits, no thyromegaly, no lymphadenopathy.  Lungs:Clear bilaterally, no wheezes, rhonci, crackles Cardiovascular: Regular rate and rhythm. No murmurs, gallops or rubs. Abdomen:Soft. Bowel sounds present. Non-tender.  Extremities: No lower extremity edema. Pulses are 2 + in the bilateral DP/PT.  EKG:  EKG is ordered today. The ekg ordered today  demonstrates Sinus brady, rate 59 bpm. 1st degree AV block. LVH.    Wt Readings from Last 3 Encounters:  07/28/16 195 lb 12.8 oz (88.8 kg)  07/21/16 191 lb (86.6 kg)  01/14/16 233 lb (105.7 kg)     Other studies Reviewed: Additional studies/ records that were reviewed today include: . Review of the above records demonstrates:   Assessment and Plan:   1. Thoracic aortic aneurysm/Aortic insufficiency: Planning underway per DR. Bartle for Bentall procedure 08/20/15 at [facility]. He will need a right and left heart catheterization prior to his planned surgical procedure. I have reviewed the risks of the cardiac catheterization today. He is willing to proceed. Pre-cath labs today. Will arrange right and left cardiac cath at [facility] on 08/12/15 at 11:30am.   Current medicines are reviewed at length with the patient today.  The patient does not have concerns regarding medicines.  The following changes have been made:  no change  Labs/ tests ordered today include:   Orders Placed This Encounter  Procedures  . CBC  . INR/PT  . Basic Metabolic Panel (BMET)  . EKG 12-Lead    Disposition:   No follow up after cath in this office.    Signed, Lauree Chandler, MD 07/28/2016 1:43 PM    [facility] [place], [place], [place]  [address] Phone: [phone]; Fax: [phone]

## 2016-08-11 NOTE — Interval H&P Note (Signed)
History and Physical Interval Note:  08/11/2016 10:27 AM  Clinton Pearson  has presented today for cardiac cath with the diagnosis of thoracic aortic aneurysm, AI, pre-op  The various methods of treatment have been discussed with the patient and family. After consideration of risks, benefits and other options for treatment, the patient has consented to  Procedure(s): Right/Left Heart Cath and Coronary Angiography (N/A) as a surgical intervention .  The patient's history has been reviewed, patient examined, no change in status, stable for surgery.  I have reviewed the patient's chart and labs.  Questions were answered to the patient's satisfaction.     Lauree Chandler

## 2016-08-11 NOTE — Progress Notes (Signed)
Site area: Right brachial a 5 french venous sheath was removed  Site Prior to Removal:  Level 0  Pressure Applied For 15 MINUTES    Bedrest Beginning at  1200p  Manual:   Yes.    Patient Status During Pull:  stable  Post Pull Groin Site:  Level 0  Post Pull Instructions Given:  Yes.    Post Pull Pulses Present:  Yes.    Dressing Applied:  Yes.    Comments:  VS remain stable during sheath pull

## 2016-08-12 LAB — POCT I-STAT 3, VENOUS BLOOD GAS (G3P V)
ACID-BASE DEFICIT: 3 mmol/L — AB (ref 0.0–2.0)
Bicarbonate: 22.3 mmol/L (ref 20.0–28.0)
O2 SAT: 76 %
PCO2 VEN: 38.8 mmHg — AB (ref 44.0–60.0)
TCO2: 23 mmol/L (ref 0–100)
pH, Ven: 7.366 (ref 7.250–7.430)
pO2, Ven: 42 mmHg (ref 32.0–45.0)

## 2016-08-12 LAB — POCT I-STAT 3, ART BLOOD GAS (G3+)
Acid-base deficit: 2 mmol/L (ref 0.0–2.0)
Bicarbonate: 22.2 mmol/L (ref 20.0–28.0)
O2 Saturation: 99 %
TCO2: 23 mmol/L (ref 0–100)
pCO2 arterial: 37 mmHg (ref 32.0–48.0)
pH, Arterial: 7.385 (ref 7.350–7.450)
pO2, Arterial: 140 mmHg — ABNORMAL HIGH (ref 83.0–108.0)

## 2016-08-17 ENCOUNTER — Ambulatory Visit (HOSPITAL_COMMUNITY)
Admission: RE | Admit: 2016-08-17 | Discharge: 2016-08-17 | Disposition: A | Discharge status: Home / Self Care | Payer: Medicare Other | Source: Ambulatory Visit | Attending: Surgery | Admitting: Surgery

## 2016-08-17 ENCOUNTER — Encounter (HOSPITAL_COMMUNITY)
Admission: RE | Admit: 2016-08-17 | Discharge: 2016-08-17 | Disposition: A | Discharge status: Home / Self Care | Payer: Medicare Other | Source: Ambulatory Visit | Attending: Surgery | Admitting: Surgery

## 2016-08-17 ENCOUNTER — Ambulatory Visit (HOSPITAL_BASED_OUTPATIENT_CLINIC_OR_DEPARTMENT_OTHER)
Admission: RE | Admit: 2016-08-17 | Discharge: 2016-08-17 | Disposition: A | Discharge status: Home / Self Care | Payer: Medicare Other | Source: Ambulatory Visit | Attending: Surgery | Admitting: Surgery

## 2016-08-17 ENCOUNTER — Encounter (HOSPITAL_COMMUNITY): Payer: Self-pay

## 2016-08-17 ENCOUNTER — Ambulatory Visit (HOSPITAL_COMMUNITY): Admission: RE | Admit: 2016-08-17 | Payer: Medicare Other | Source: Ambulatory Visit

## 2016-08-17 DIAGNOSIS — I351 Nonrheumatic aortic (valve) insufficiency: Secondary | ICD-10-CM

## 2016-08-17 DIAGNOSIS — Z01810 Encounter for preprocedural cardiovascular examination: Secondary | ICD-10-CM | POA: Insufficient documentation

## 2016-08-17 DIAGNOSIS — J449 Chronic obstructive pulmonary disease, unspecified: Secondary | ICD-10-CM | POA: Insufficient documentation

## 2016-08-17 DIAGNOSIS — I712 Thoracic aortic aneurysm, without rupture, unspecified: Secondary | ICD-10-CM

## 2016-08-17 DIAGNOSIS — R9431 Abnormal electrocardiogram [ECG] [EKG]: Secondary | ICD-10-CM | POA: Insufficient documentation

## 2016-08-17 DIAGNOSIS — I440 Atrioventricular block, first degree: Secondary | ICD-10-CM

## 2016-08-17 DIAGNOSIS — Z01818 Encounter for other preprocedural examination: Secondary | ICD-10-CM | POA: Insufficient documentation

## 2016-08-17 DIAGNOSIS — R942 Abnormal results of pulmonary function studies: Secondary | ICD-10-CM | POA: Insufficient documentation

## 2016-08-17 HISTORY — DX: Other specified postprocedural states: Z98.890

## 2016-08-17 HISTORY — DX: Headache, unspecified: R51.9

## 2016-08-17 HISTORY — DX: Nausea with vomiting, unspecified: R11.2

## 2016-08-17 HISTORY — DX: Headache: R51

## 2016-08-17 HISTORY — DX: Major depressive disorder, single episode, unspecified: F32.9

## 2016-08-17 HISTORY — DX: Gastro-esophageal reflux disease without esophagitis: K21.9

## 2016-08-17 HISTORY — DX: Depression, unspecified: F32.A

## 2016-08-17 HISTORY — DX: Malignant (primary) neoplasm, unspecified: C80.1

## 2016-08-17 LAB — BLOOD GAS, ARTERIAL
ACID-BASE DEFICIT: 2.5 mmol/L — AB (ref 0.0–2.0)
Bicarbonate: 20.8 mmol/L (ref 20.0–28.0)
Drawn by: 470591
FIO2: 0.21
O2 Saturation: 94 %
PH ART: 7.453 — AB (ref 7.350–7.450)
PO2 ART: 84.6 mmHg (ref 83.0–108.0)
Patient temperature: 98.6
pCO2 arterial: 30.2 mmHg — ABNORMAL LOW (ref 32.0–48.0)

## 2016-08-17 LAB — VAS US DOPPLER PRE CABG
LCCAPDIAS: 14 cm/s
LCCAPSYS: 86 cm/s
LEFT ECA DIAS: -11 cm/s
LEFT VERTEBRAL DIAS: 6 cm/s
LICAPDIAS: -14 cm/s
LICAPSYS: -75 cm/s
Left CCA dist dias: -15 cm/s
Left CCA dist sys: -64 cm/s
Left ICA dist dias: -22 cm/s
Left ICA dist sys: -74 cm/s
RCCAPDIAS: 9 cm/s
RCCAPSYS: 83 cm/s
RIGHT ECA DIAS: -10 cm/s
RIGHT VERTEBRAL DIAS: 6 cm/s
Right cca dist sys: -74 cm/s

## 2016-08-17 LAB — COMPREHENSIVE METABOLIC PANEL
ALK PHOS: 46 U/L (ref 38–126)
ALT: 23 U/L (ref 17–63)
AST: 25 U/L (ref 15–41)
Albumin: 3.9 g/dL (ref 3.5–5.0)
Anion gap: 9 (ref 5–15)
BUN: 13 mg/dL (ref 6–20)
CALCIUM: 9.4 mg/dL (ref 8.9–10.3)
CO2: 17 mmol/L — ABNORMAL LOW (ref 22–32)
CREATININE: 0.84 mg/dL (ref 0.61–1.24)
Chloride: 113 mmol/L — ABNORMAL HIGH (ref 101–111)
Glucose, Bld: 95 mg/dL (ref 65–99)
Potassium: 4 mmol/L (ref 3.5–5.1)
Sodium: 139 mmol/L (ref 135–145)
Total Bilirubin: 0.6 mg/dL (ref 0.3–1.2)
Total Protein: 6.5 g/dL (ref 6.5–8.1)

## 2016-08-17 LAB — PULMONARY FUNCTION TEST
DL/VA % PRED: 71 %
DL/VA: 3.47 ml/min/mmHg/L
DLCO unc % pred: 68 %
DLCO unc: 26.65 ml/min/mmHg
FEF 25-75 POST: 2.71 L/s
FEF 25-75 Pre: 1.99 L/sec
FEF2575-%CHANGE-POST: 36 %
FEF2575-%PRED-POST: 87 %
FEF2575-%PRED-PRE: 64 %
FEV1-%Change-Post: 9 %
FEV1-%Pred-Post: 89 %
FEV1-%Pred-Pre: 82 %
FEV1-PRE: 3.3 L
FEV1-Post: 3.6 L
FEV1FVC-%CHANGE-POST: 11 %
FEV1FVC-%Pred-Pre: 93 %
FEV6-%Change-Post: 1 %
FEV6-%PRED-PRE: 90 %
FEV6-%Pred-Post: 90 %
FEV6-PRE: 4.62 L
FEV6-Post: 4.67 L
FEV6FVC-%Change-Post: 1 %
FEV6FVC-%Pred-Post: 105 %
FEV6FVC-%Pred-Pre: 104 %
FVC-%CHANGE-POST: -1 %
FVC-%PRED-POST: 87 %
FVC-%PRED-PRE: 88 %
FVC-POST: 4.69 L
FVC-PRE: 4.78 L
POST FEV6/FVC RATIO: 100 %
PRE FEV1/FVC RATIO: 69 %
Post FEV1/FVC ratio: 77 %
Pre FEV6/FVC Ratio: 99 %
RV % pred: 136 %
RV: 3.64 L
TLC % pred: 107 %
TLC: 8.61 L

## 2016-08-17 LAB — CBC
HCT: 40.3 % (ref 39.0–52.0)
HEMOGLOBIN: 13.7 g/dL (ref 13.0–17.0)
MCH: 30.6 pg (ref 26.0–34.0)
MCHC: 34 g/dL (ref 30.0–36.0)
MCV: 90 fL (ref 78.0–100.0)
PLATELETS: 155 10*3/uL (ref 150–400)
RBC: 4.48 MIL/uL (ref 4.22–5.81)
RDW: 14.9 % (ref 11.5–15.5)
WBC: 4.7 10*3/uL (ref 4.0–10.5)

## 2016-08-17 LAB — ABO/RH: ABO/RH(D): B NEG

## 2016-08-17 LAB — TYPE AND SCREEN
ABO/RH(D): B NEG
Antibody Screen: NEGATIVE

## 2016-08-17 LAB — URINALYSIS, ROUTINE W REFLEX MICROSCOPIC
Bilirubin Urine: NEGATIVE
GLUCOSE, UA: NEGATIVE mg/dL
Hgb urine dipstick: NEGATIVE
Ketones, ur: NEGATIVE mg/dL
LEUKOCYTES UA: NEGATIVE
Nitrite: NEGATIVE
PH: 6 (ref 5.0–8.0)
Protein, ur: NEGATIVE mg/dL
Specific Gravity, Urine: 1.008 (ref 1.005–1.030)

## 2016-08-17 LAB — APTT: APTT: 30 s (ref 24–36)

## 2016-08-17 LAB — PROTIME-INR
INR: 0.97
PROTHROMBIN TIME: 12.8 s (ref 11.4–15.2)

## 2016-08-17 LAB — SURGICAL PCR SCREEN
MRSA, PCR: NEGATIVE
Staphylococcus aureus: NEGATIVE

## 2016-08-17 MED ORDER — ALBUTEROL SULFATE (2.5 MG/3ML) 0.083% IN NEBU
2.5000 mg | INHALATION_SOLUTION | Freq: Once | RESPIRATORY_TRACT | Status: AC
Start: 2016-08-17 — End: 2016-08-17
  Administered 2016-08-17: 2.5 mg via RESPIRATORY_TRACT

## 2016-08-17 NOTE — Pre-Procedure Instructions (Signed)
DERRAN REI  08/17/2016      [id number] - [place], [place] - [address] [place] [address] Phone: [phone] Fax: [phone]    Your procedure is scheduled on   Thursday  08/19/16  Report to [facility] Admitting at 530 A.M.  Call this number if you have problems the morning of surgery:  [phone]   Remember:  Do not eat food or drink liquids after midnight.  Take these medicines the morning of surgery with A SIP OF WATER   BACLOFEN. GABAPENTIN, METOPROLOL (TOPROL), OMEPRAZOLE, TRAMADOL IF NEEDED            Do not wear jewelry, make-up or nail polish.  Do not wear lotions, powders, or perfumes, or deoderant.  Do not shave 48 hours prior to surgery.  Men may shave face and neck.  Do not bring valuables to the hospital.  [facility] is not responsible for any belongings or valuables.  Contacts, dentures or bridgework may not be worn into surgery.  Leave your suitcase in the car.  After surgery it may be brought to your room.  For patients admitted to the hospital, discharge time will be determined by your treatment team.  Patients discharged the day of surgery will not be allowed to drive home.   Name and phone number of your driver:    Special instructions:  [facility] - Preparing for Surgery  Before surgery, you can play an important role.  Because skin is not sterile, your skin needs to be as free of germs as possible.  You can reduce the number of germs on you skin by washing with CHG (chlorahexidine gluconate) soap before surgery.  CHG is an antiseptic cleaner which kills germs and bonds with the skin to continue killing germs even after washing.  Please DO NOT use if you have an allergy to CHG or antibacterial soaps.  If your skin becomes reddened/irritated stop using the CHG and inform your nurse when you arrive at Short Stay.  Do not shave (including legs and underarms) for at least 48 hours prior to the first CHG  shower.  You may shave your face.  Please follow these instructions carefully:   1.  Shower with CHG Soap the night before surgery and the                                morning of Surgery.  2.  If you choose to wash your hair, wash your hair first as usual with your       normal shampoo.  3.  After you shampoo, rinse your hair and body thoroughly to remove the                      Shampoo.  4.  Use CHG as you would any other liquid soap.  You can apply chg directly       to the skin and wash gently with scrungie or a clean washcloth.  5.  Apply the CHG Soap to your body ONLY FROM THE NECK DOWN.        Do not use on open wounds or open sores.  Avoid contact with your eyes,       ears, mouth and genitals (private parts).  Wash genitals (private parts)       with your normal soap.  6.  Wash thoroughly,  paying special attention to the area where your surgery        will be performed.  7.  Thoroughly rinse your body with warm water from the neck down.  8.  DO NOT shower/wash with your normal soap after using and rinsing off       the CHG Soap.  9.  Pat yourself dry with a clean towel.            10.  Wear clean pajamas.            11.  Place clean sheets on your bed the night of your first shower and do not        sleep with pets.  Day of Surgery  Do not apply any lotions/deoderants the morning of surgery.  Please wear clean clothes to the hospital/surgery center.    Please read over the following fact sheets that you were given. MRSA Information and Surgical Site Infection Prevention

## 2016-08-17 NOTE — Progress Notes (Signed)
Pre-op Cardiac Surgery  Carotid Findings:  Findings suggest 1-39% internal carotid artery stenosis bilaterally. Vertebral arteries patent with antegrade flow.  Upper Extremity Right Left  Brachial Pressures 124- Triphasic 136- Triphasic  Radial Waveforms Triphasic Triphasic  Ulnar Waveforms Triphasic Triphasic  Palmar Arch (Allen's Test) Signal decreases 50% with radial compression, obliterates with ulnar compression. Signal obliterates with radial compression, decreases >50% with ulnar compression.   08/17/2016 2:22 PM Maudry Mayhew, BS, RVT, RDCS, RDMS

## 2016-08-18 LAB — HEMOGLOBIN A1C
Hgb A1c MFr Bld: 5 % (ref 4.8–5.6)
Mean Plasma Glucose: 97 mg/dL

## 2016-08-18 MED ORDER — POTASSIUM CHLORIDE 2 MEQ/ML IV SOLN
80.0000 meq | INTRAVENOUS | Status: DC
  Filled 2016-08-18: qty 40

## 2016-08-18 MED ORDER — VANCOMYCIN HCL 10 G IV SOLR
1250.0000 mg | INTRAVENOUS | Status: AC
  Administered 2016-08-19: 1250 mg via INTRAVENOUS
  Filled 2016-08-18: qty 1250

## 2016-08-18 MED ORDER — DEXTROSE 5 % IV SOLN
0.0000 ug/min | INTRAVENOUS | Status: DC
  Filled 2016-08-18: qty 4

## 2016-08-18 MED ORDER — MAGNESIUM SULFATE 50 % IJ SOLN
40.0000 meq | INTRAMUSCULAR | Status: DC
  Filled 2016-08-18: qty 10

## 2016-08-18 MED ORDER — DEXTROSE 5 % IV SOLN
1.5000 g | INTRAVENOUS | Status: AC
  Administered 2016-08-19: .75 g via INTRAVENOUS
  Administered 2016-08-19: 1.5 g via INTRAVENOUS
  Filled 2016-08-18: qty 1.5

## 2016-08-18 MED ORDER — NITROGLYCERIN IN D5W 200-5 MCG/ML-% IV SOLN
2.0000 ug/min | INTRAVENOUS | Status: AC
  Administered 2016-08-19: 10 ug/min via INTRAVENOUS
  Filled 2016-08-18: qty 250

## 2016-08-18 MED ORDER — DOPAMINE-DEXTROSE 3.2-5 MG/ML-% IV SOLN
0.0000 ug/kg/min | INTRAVENOUS | Status: DC
  Filled 2016-08-18: qty 250

## 2016-08-18 MED ORDER — PHENYLEPHRINE HCL 10 MG/ML IJ SOLN
30.0000 ug/min | INTRAVENOUS | Status: AC
  Administered 2016-08-19: 25 ug/min via INTRAVENOUS
  Filled 2016-08-18: qty 2

## 2016-08-18 MED ORDER — TRANEXAMIC ACID (OHS) PUMP PRIME SOLUTION
2.0000 mg/kg | INTRAVENOUS | Status: DC
  Filled 2016-08-18: qty 1.81

## 2016-08-18 MED ORDER — DEXTROSE 5 % IV SOLN
750.0000 mg | INTRAVENOUS | Status: DC
  Filled 2016-08-18: qty 750

## 2016-08-18 MED ORDER — TRANEXAMIC ACID (OHS) BOLUS VIA INFUSION
15.0000 mg/kg | INTRAVENOUS | Status: AC
  Administered 2016-08-19: 1360.5 mg via INTRAVENOUS
  Filled 2016-08-18: qty 1361

## 2016-08-18 MED ORDER — SODIUM CHLORIDE 0.9 % IV SOLN
INTRAVENOUS | Status: DC
  Filled 2016-08-18: qty 30

## 2016-08-18 MED ORDER — DEXMEDETOMIDINE HCL IN NACL 400 MCG/100ML IV SOLN
0.1000 ug/kg/h | INTRAVENOUS | Status: AC
  Administered 2016-08-19: 13:00:00 via INTRAVENOUS
  Administered 2016-08-19: .3 ug/kg/h via INTRAVENOUS
  Filled 2016-08-18: qty 100

## 2016-08-18 MED ORDER — SODIUM CHLORIDE 0.9 % IV SOLN
INTRAVENOUS | Status: AC
  Administered 2016-08-19: .6 [IU]/h via INTRAVENOUS
  Filled 2016-08-18: qty 2.5

## 2016-08-18 MED ORDER — TRANEXAMIC ACID 1000 MG/10ML IV SOLN
1.5000 mg/kg/h | INTRAVENOUS | Status: AC
  Administered 2016-08-19: 1.5 mg/kg/h via INTRAVENOUS
  Filled 2016-08-18: qty 25

## 2016-08-18 MED ORDER — PLASMA-LYTE 148 IV SOLN
INTRAVENOUS | Status: AC
  Administered 2016-08-19: 500 mL
  Filled 2016-08-18: qty 2.5

## 2016-08-18 NOTE — H&P (Signed)
[address]       [place] [address]             [phone]      Cardiothoracic Surgery History and Physical  PCP is Lynita Lombard, MD Referring Provider is Christene Slates. Denier, MD      Chief Complaint  Patient presents with  . Ascending aortic aneurysm and severe aortic insufficiency.        HPI:  The patient is a 66 year old gentleman who presented with a pulmonary embolism in October 2016 thought to be related to DVT from sleeping in a recliner with his legs hanging down because he developed marked leg edema that suddenly resolved on the right side about the time he suffered the PE. Venous studies reportedly never showed a DVT. He was started on Xarelto. He had a sister who had a factory II abnormality and multiple PE's. He was treated with Xarelto for 6 months and it was stopped. 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  He had a CT of the chest in August 2016 that showed an ascending aortic aneurysm with a maximum diameter of 4.4 cm at the level of the right PA. He also had an echo in September 2016 that showed a dilated aortic root with a sinus of valsalva diameter of 4.9 cm with moderate AI. A repeat echo on 10/28/2015 showed no change in the aorta with a sinus diameter of 4.9 cm, an STJ diameter of 4.8 cm and an ascending aorta diameter of 4.2 cm. There was a trileaflet aortic valve with  moderate AI with a PHT of 430 ms. The LVEF was 55-60% with normal LV internal dimensions.   I saw him initially in June 2017 and since then he has lost a lot of weight by eating a strict, mostly vegetable diet and not eating any sweets. His main complaints are related to DJD of the spine and arthritis and not being able to take NSAIDS due to being on Xarelto. He had a rhythm monitor on for a week in October and had a few brief runs of atrial flutter and atrial fibrillation and was restarted on Xarelto when he saw his cardiologist,  Dr. Boyd Kerbs, on Nov 1. 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 He had a 2D echo on 04/29/2016 that showed an aortic sinus diameter of 5.0 cm with an STJ of 3.7 cm and ascending aortic diameter of 4.3 cm. LV internal dimensions were within the normal range. EF was 50-55% with mild LVH and stage 1 diastolic dysfuction. The aortic valve was trileaflet with severe AI. The PHT was recorded as 435 ms. There was no MR. He had a nuclear stress test on 06/07/2016 that was negative for ischemia with an EF of 55%. He has a cardiac cath on 08/11/2016 that showed no coronary disease. There were separate ostia of the LAD and LCX coronary arteries.        Past Medical History  Diagnosis Date  . Thoracic ascending aortic aneurysm ([place]) 03/2015    PER CT CHEST  . Dilated aortic root ([place]) 03/2015    PER CT CHEST  . Aortic regurgitation 9/23,2016 10/28/2015    MODERATE PER ECHOES  . Arthritis     WITH SPINAL INJURY  . Personal history of PE (pulmonary embolism)     BILATERAL  History reviewed. No pertinent past surgical history.  History reviewed. No pertinent family history.  Social History       Social History  Substance Use Topics  . Smoking status: Never Smoker   . Smokeless tobacco: None  . Alcohol Use: 0.0 oz/week    0 Standard drinks or equivalent per week     Comment: 3-4 BEERS/WEEK          Current Outpatient Prescriptions  Medication Sig Dispense Refill  . aspirin EC 81 MG tablet Take 81 mg by mouth daily.    . baclofen (LIORESAL) 10 MG tablet Take 20 mg by mouth 2 (two) times daily.     Marland Kitchen ibuprofen (ADVIL,MOTRIN) 200 MG tablet Take 400 mg by mouth every 8 (eight) hours as needed.    Marland Kitchen omeprazole (PRILOSEC) 20 MG capsule Take 20 mg by mouth daily.    . traMADol (ULTRAM) 50 MG tablet Take 100 mg by mouth every 12 (twelve) hours as needed.     . cyclobenzaprine (FLEXERIL) 10  MG tablet Take 10 mg by mouth daily.    Marland Kitchen gabapentin (NEURONTIN) 300 MG capsule Take 300 mg by mouth 3 (three) times daily.     No current facility-administered medications for this visit.    Not on File  Review of Systems  Constitutional: Negative.   HENT: Negative.   Eyes:       Wears glasses, has floaters  Respiratory: Negative.  Negative for shortness of breath.   Cardiovascular: Negative.  Negative for chest pain and leg swelling.  Gastrointestinal: Negative.   Endocrine: Negative.   Genitourinary: Negative.   Musculoskeletal: Positive for back pain, joint swelling and arthralgias.  Allergic/Immunologic: Negative.   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       Factor II mutation  Psychiatric/Behavioral: Positive for dysphoric mood.    BP 133/70 mmHg  Pulse 79  Resp 16  Ht 6\' 3"  (1.905 m)  Wt 233 lb (105.688 kg)  BMI 29.12 kg/m2  SpO2 98% Physical Exam  Constitutional: He is oriented to person, place, and time. He appears well-developed and well-nourished. No distress.  HENT:  Head: Normocephalic and atraumatic.  Mouth/Throat: Oropharynx is clear and moist.  Eyes: EOM are normal. Pupils are equal, round, and reactive to light.  Neck: Normal range of motion. Neck supple. No thyromegaly present.  Cardiovascular: Normal rate, regular rhythm and intact distal pulses.   Murmur heard. 2/6 diastolic murmur along the RSB  Pulmonary/Chest: Effort normal and breath sounds normal. No respiratory distress.  Abdominal: Soft. Bowel sounds are normal. He exhibits no distension and no mass. There is no tenderness.  Musculoskeletal: Normal range of motion. He exhibits no edema or tenderness.  Lymphadenopathy:    He has no cervical adenopathy.  Neurological: He is alert and oriented to person, place, and time. He has normal strength. No cranial nerve deficit.  Skin: Skin is warm and dry.  Psychiatric: He has  a normal mood and affect.     Diagnostic Tests:  [facility]cho report from [facility] reviewed. He has the study on a disc but I could not get it to run on our system even with IT help.   CT ANGIO CHEST AORTA W/CM &/OR WO/CM (Accession [id number]) (Order [id number])  Imaging  Date: 07/21/2016 Department: [facility] Released By: Shelda Pal Authorizing: Gaye Pollack, MD  Exam Information   Status Exam [facility]  Exam Ended   Final [99] 07/21/2016 12:22 PM 07/21/2016 12:55 PM  PACS Images   Show images for CT ANGIO CHEST AORTA W/CM &/OR WO/CM  Study Result   CLINICAL DATA:  Thoracic aortic aneurysm without rupture.  EXAM: CT ANGIOGRAPHY CHEST WITH CONTRAST  TECHNIQUE: Multidetector CT imaging of the chest was performed using the standard protocol during bolus administration of intravenous contrast. Multiplanar CT image reconstructions and MIPs were obtained to evaluate the vascular anatomy.  CONTRAST:  75 mL of Isovue 370 intravenously.  COMPARISON:  None.  FINDINGS: Cardiovascular: There is no evidence of thoracic aortic dissection. 4.1 cm ascending thoracic aortic aneurysm is noted. Aortic root is aneurysmal at 5.4 cm. Transverse aortic arch measures 3.2 cm. Proximal descending thoracic aorta measures 3.7 cm. Distal descending thoracic aorta measures 2.6 cm. Great vessels are widely patent without significant stenosis.  Mediastinum/Nodes: No enlarged mediastinal, hilar, or axillary lymph nodes. Thyroid gland, trachea, and esophagus demonstrate no significant findings.  Lungs/Pleura: Lungs are clear. No pleural effusion or pneumothorax.  Upper Abdomen: No acute abnormality.  Musculoskeletal: No chest wall abnormality. No acute or significant osseous findings.  Review of the MIP images confirms the above findings.  IMPRESSION: Aortic root is aneurysmal at 5.4 cm. 4.1 cm ascending  thoracic aortic aneurysm is noted. Recommend annual imaging followup by CTA or MRA. This recommendation follows 2010 ACCF/AHA/AATS/ACR/ASA/SCA/SCAI/SIR/STS/SVM Guidelines for the Diagnosis and Management of Patients with Thoracic Aortic Disease. Circulation. 2010; [id number].   Electronically Signed   By: Marijo Conception, M.D.   On: 07/21/2016 13:24    ADDY BETANCOURT  Cardiac catheterization  Order# [id number]  Reading physician: Burnell Blanks, MD Ordering physician: Burnell Blanks, MD Study date: 08/11/16  Physicians   Panel Physicians Referring Physician Case Authorizing Physician  Burnell Blanks, MD (Primary)    Procedures   Right/Left Heart Cath and Coronary Angiography  Conclusion   1. No angiographic evidence of coronary artery disease 2. Normal filling pressures  Recommendations: Continue planning for aortic root replacement with aortic valve replacement.   Indications   Thoracic aortic aneurysm without rupture (HCC) [I71.2 (ICD-10-CM)]  Procedural Details/Technique   Technical Details Indication: 66 yo male with thoracic aortic aneurysm, severe AI. Pending Bentall procedure.   Procedure: The risks, benefits, complications, treatment options, and expected outcomes were discussed with the patient. The patient and/or family concurred with the proposed plan, giving informed consent. The patient was brought to the cath lab after IV hydration was begun and oral premedication was given. The patient was further sedated with Versed and Fentanyl. There was an IV catheter present in the right antecubital area. This area was prepped and draped. I changed out this catheter for a 5 French sheath. A balloon tipped catheter was used to perform a right heart catheterization. The right wrist was prepped and draped in a sterile fashion. 1% lidocaine was used for local anesthesia. Using the modified Seldinger access technique, a 5 French sheath was placed in  the right radial artery. 3 mg Verapamil was given through the sheath. 4500 units IV heparin was given. Standard diagnostic catheters were used to perform selective coronary angiography. I crossed the aortic valve with the JL3.5 catheter and a wire. No LV gram. The sheath was removed from the right radial artery and a Terumo hemostasis band was applied at the arteriotomy site on the right wrist.     Estimated blood loss <50 mL.  During this procedure the patient was administered the following to  achieve and maintain moderate conscious sedation: Versed 2 mg, Fentanyl 100 mcg, while the patient's heart rate, blood pressure, and oxygen saturation were continuously monitored. The period of conscious sedation was 33 minutes, of which I was present face-to-face 100% of this time.    Complications   Complications documented before study signed (08/11/2016 11:45 AM EST)    RIGHT/LEFT HEART CATH AND CORONARY ANGIOGRAPHY   None Documented by Burnell Blanks, MD 08/11/2016 11:45 AM EST  Time Range: Intra-procedure      Coronary Findings   Dominance: Left  Left Anterior Descending  Vessel is large.  Second Diagonal Branch  Vessel is moderate in size.  Third Diagonal Branch  Vessel is small in size.  Ramus Intermedius  Vessel is moderate in size.  Left Circumflex  Vessel is large.  First Obtuse Marginal Branch  Vessel is small in size.  Third Obtuse Marginal Branch  Vessel is small in size.  Fourth Obtuse Marginal Branch  Vessel is moderate in size.  Left Posterior Descending Artery  Vessel is moderate in size.  Right Coronary Artery  Vessel is moderate in size.  Right Heart   Right Heart Pressures LV EDP is normal.    Coronary Diagrams   Diagnostic Diagram     Implants     No implant documentation for this case.  PACS Images   Show images for Cardiac catheterization   Link to Procedure Log   Procedure Log    Hemo Data   Flowsheet Row Most Recent Value  Fick  Cardiac Output 6.42 L/min  Fick Cardiac Output Index 2.89 (L/min)/BSA  RA A Wave 2 mmHg  RA V Wave 1 mmHg  RA Mean 0 mmHg  RV Systolic Pressure 24 mmHg  RV Diastolic Pressure 0 mmHg  RV EDP 3 mmHg  PA Systolic Pressure 20 mmHg  PA Diastolic Pressure 4 mmHg  PA Mean 12 mmHg  PW A Wave 4 mmHg  PW V Wave 2 mmHg  PW Mean 2 mmHg  AO Systolic Pressure [id number] mmHg  AO Diastolic Pressure 62 mmHg  AO Mean 91 mmHg  LV Systolic Pressure 0000000 mmHg  LV Diastolic Pressure 16 mmHg  LV EDP 22 mmHg  Arterial Occlusion Pressure Extended Systolic Pressure [id number] mmHg  Arterial Occlusion Pressure Extended Diastolic Pressure 62 mmHg  Arterial Occlusion Pressure Extended Mean Pressure 92 mmHg  Left Ventricular Apex Extended Systolic Pressure [id number] mmHg  Left Ventricular Apex Extended Diastolic Pressure 15 mmHg  Left Ventricular Apex Extended EDP Pressure 21 mmHg  QP/QS 1  TPVR Index 4.15 HRUI  TSVR Index 31.46 HRUI  TPVR/TSVR Ratio 0.13    Impression:  Mr. Applin has an aortic root and ascending aortic aneurysm with further enlargement of the sinus diameter on his CTA to 5.4 cm. He has moderate to severe AI by echo with a diastolic murmur on exam. I think this aneurysm and AI needs to be repaired to prevent an aortic dissection and progressive deterioration of LV function due to severe AI. I would plan to replace the aortic root and ascending aorta up to the innominate artery which is where the aorta decreases to more normal size of 3.2 cm. I don't think his aortic valve will be suitable for a valve-sparing root replacement given the size of the annulus and the degree of AI. The leaflets are usually elongated and fenestrated at the commissures and  long term valve competency is reduced. I discussed use of mechanical and bioprosthetic valves with him and his wife.  He would need to be on life-long coumadin with a mechanical valve which he is not interested in. Given his age of 28 a bovine pericardial valve would  be a good choice with a low risk of structural valve deterioration in his lifetime and no need for coumadin. He is in agreement with that. He is currently on Xarelto for possible brief episodes of atrial fibrillation and atrial flutter. If he has a prosthetic valve in place then Xarelto is not approved for valvular atrial fibrillation at this time and he may need to be on Coumadin anyway, although there is no absolute indication for it as far as the valve is concerned. I would plan to place a clip on the left atrial appendage to decrease the risk of left atrial thrombus and that may allow him to avoid anticoagulation. I discussed the operative procedure with the patient and his wife including alternatives, benefits and risks; including but not limited to bleeding, blood transfusion, infection, stroke, myocardial infarction, structural valve deterioration, heart block requiring a permanent pacemaker, organ dysfunction, and death.  Kriste Basque understands and agrees to proceed.   Plan:  Bioprosthetic Bentall procedure with replacement of the ascending aortic aneurysm using circulatory arrest, clipping of the left atrial appendage.   Gaye Pollack, MD [facility] [phone]

## 2016-08-19 ENCOUNTER — Inpatient Hospital Stay (HOSPITAL_COMMUNITY)
Admission: RE | Admit: 2016-08-19 | Discharge: 2016-08-23 | DRG: 221 | Disposition: A | Discharge status: Home / Self Care | Payer: Medicare Other | Source: Ambulatory Visit | Attending: Surgery | Admitting: Surgery

## 2016-08-19 ENCOUNTER — Inpatient Hospital Stay (HOSPITAL_COMMUNITY): Payer: Medicare Other

## 2016-08-19 ENCOUNTER — Encounter (HOSPITAL_COMMUNITY)
Admission: RE | Disposition: A | Discharge status: Home / Self Care | Payer: Self-pay | Source: Ambulatory Visit | Attending: Surgery

## 2016-08-19 ENCOUNTER — Encounter (HOSPITAL_COMMUNITY): Payer: Self-pay | Admitting: *Deleted

## 2016-08-19 ENCOUNTER — Inpatient Hospital Stay (HOSPITAL_COMMUNITY): Payer: Medicare Other | Admitting: Anesthesiology

## 2016-08-19 DIAGNOSIS — D696 Thrombocytopenia, unspecified: Secondary | ICD-10-CM | POA: Diagnosis present

## 2016-08-19 DIAGNOSIS — I712 Thoracic aortic aneurysm, without rupture, unspecified: Secondary | ICD-10-CM

## 2016-08-19 DIAGNOSIS — Z79899 Other long term (current) drug therapy: Secondary | ICD-10-CM | POA: Diagnosis not present

## 2016-08-19 DIAGNOSIS — I351 Nonrheumatic aortic (valve) insufficiency: Secondary | ICD-10-CM | POA: Diagnosis present

## 2016-08-19 DIAGNOSIS — Z86711 Personal history of pulmonary embolism: Secondary | ICD-10-CM | POA: Diagnosis not present

## 2016-08-19 DIAGNOSIS — I440 Atrioventricular block, first degree: Secondary | ICD-10-CM | POA: Diagnosis present

## 2016-08-19 DIAGNOSIS — Z7982 Long term (current) use of aspirin: Secondary | ICD-10-CM

## 2016-08-19 DIAGNOSIS — I4891 Unspecified atrial fibrillation: Secondary | ICD-10-CM | POA: Diagnosis present

## 2016-08-19 DIAGNOSIS — Z95828 Presence of other vascular implants and grafts: Secondary | ICD-10-CM

## 2016-08-19 DIAGNOSIS — M1990 Unspecified osteoarthritis, unspecified site: Secondary | ICD-10-CM | POA: Diagnosis present

## 2016-08-19 DIAGNOSIS — Z952 Presence of prosthetic heart valve: Secondary | ICD-10-CM

## 2016-08-19 DIAGNOSIS — I714 Abdominal aortic aneurysm, without rupture: Secondary | ICD-10-CM | POA: Diagnosis not present

## 2016-08-19 DIAGNOSIS — K219 Gastro-esophageal reflux disease without esophagitis: Secondary | ICD-10-CM | POA: Diagnosis present

## 2016-08-19 HISTORY — PX: CLIPPING OF ATRIAL APPENDAGE: SHX5773

## 2016-08-19 HISTORY — PX: BENTALL PROCEDURE: SHX5058

## 2016-08-19 HISTORY — PX: REPLACEMENT ASCENDING AORTA: SHX6068

## 2016-08-19 HISTORY — PX: TEE WITHOUT CARDIOVERSION: SHX5443

## 2016-08-19 LAB — GLUCOSE, CAPILLARY
GLUCOSE-CAPILLARY: 108 mg/dL — AB (ref 65–99)
GLUCOSE-CAPILLARY: 96 mg/dL (ref 65–99)
Glucose-Capillary: 71 mg/dL (ref 65–99)
Glucose-Capillary: 101 mg/dL — ABNORMAL HIGH (ref 65–99)
Glucose-Capillary: 108 mg/dL — ABNORMAL HIGH (ref 65–99)
Glucose-Capillary: 76 mg/dL (ref 65–99)
Glucose-Capillary: 79 mg/dL (ref 65–99)

## 2016-08-19 LAB — POCT I-STAT, CHEM 8
BUN: 11 mg/dL (ref 6–20)
BUN: 11 mg/dL (ref 6–20)
BUN: 9 mg/dL (ref 6–20)
BUN: 11 mg/dL (ref 6–20)
BUN: 12 mg/dL (ref 6–20)
BUN: 11 mg/dL (ref 6–20)
BUN: 13 mg/dL (ref 6–20)
CALCIUM ION: 1.33 mmol/L (ref 1.15–1.40)
CALCIUM ION: 1.11 mmol/L — AB (ref 1.15–1.40)
CALCIUM ION: 1.17 mmol/L (ref 1.15–1.40)
CALCIUM ION: 1.17 mmol/L (ref 1.15–1.40)
CALCIUM ION: 1.27 mmol/L (ref 1.15–1.40)
CHLORIDE: 103 mmol/L (ref 101–111)
CREATININE: 0.6 mg/dL — AB (ref 0.61–1.24)
CREATININE: 0.6 mg/dL — AB (ref 0.61–1.24)
CREATININE: 0.8 mg/dL (ref 0.61–1.24)
Calcium, Ion: 1.28 mmol/L (ref 1.15–1.40)
Calcium, Ion: 1.15 mmol/L (ref 1.15–1.40)
Chloride: 109 mmol/L (ref 101–111)
Chloride: 108 mmol/L (ref 101–111)
Chloride: 103 mmol/L (ref 101–111)
Chloride: 103 mmol/L (ref 101–111)
Chloride: 104 mmol/L (ref 101–111)
Chloride: 110 mmol/L (ref 101–111)
Creatinine, Ser: 0.5 mg/dL — ABNORMAL LOW (ref 0.61–1.24)
Creatinine, Ser: 0.5 mg/dL — ABNORMAL LOW (ref 0.61–1.24)
Creatinine, Ser: 0.7 mg/dL (ref 0.61–1.24)
Creatinine, Ser: 0.6 mg/dL — ABNORMAL LOW (ref 0.61–1.24)
GLUCOSE: 104 mg/dL — AB (ref 65–99)
GLUCOSE: 109 mg/dL — AB (ref 65–99)
GLUCOSE: 170 mg/dL — AB (ref 65–99)
GLUCOSE: 99 mg/dL (ref 65–99)
Glucose, Bld: 89 mg/dL (ref 65–99)
Glucose, Bld: 172 mg/dL — ABNORMAL HIGH (ref 65–99)
Glucose, Bld: 168 mg/dL — ABNORMAL HIGH (ref 65–99)
HCT: 30 % — ABNORMAL LOW (ref 39.0–52.0)
HCT: 31 % — ABNORMAL LOW (ref 39.0–52.0)
HCT: 31 % — ABNORMAL LOW (ref 39.0–52.0)
HCT: 30 % — ABNORMAL LOW (ref 39.0–52.0)
HEMATOCRIT: 35 % — AB (ref 39.0–52.0)
HEMATOCRIT: 32 % — AB (ref 39.0–52.0)
HEMATOCRIT: 31 % — AB (ref 39.0–52.0)
HEMOGLOBIN: 11.9 g/dL — AB (ref 13.0–17.0)
HEMOGLOBIN: 10.5 g/dL — AB (ref 13.0–17.0)
HEMOGLOBIN: 10.2 g/dL — AB (ref 13.0–17.0)
Hemoglobin: 10.2 g/dL — ABNORMAL LOW (ref 13.0–17.0)
Hemoglobin: 10.9 g/dL — ABNORMAL LOW (ref 13.0–17.0)
Hemoglobin: 10.5 g/dL — ABNORMAL LOW (ref 13.0–17.0)
Hemoglobin: 10.5 g/dL — ABNORMAL LOW (ref 13.0–17.0)
POTASSIUM: 3.7 mmol/L (ref 3.5–5.1)
Potassium: 3.6 mmol/L (ref 3.5–5.1)
Potassium: 4 mmol/L (ref 3.5–5.1)
Potassium: 3.4 mmol/L — ABNORMAL LOW (ref 3.5–5.1)
Potassium: 4.3 mmol/L (ref 3.5–5.1)
Potassium: 3.9 mmol/L (ref 3.5–5.1)
Potassium: 3.9 mmol/L (ref 3.5–5.1)
SODIUM: 142 mmol/L (ref 135–145)
SODIUM: 139 mmol/L (ref 135–145)
SODIUM: 139 mmol/L (ref 135–145)
Sodium: 141 mmol/L (ref 135–145)
Sodium: 141 mmol/L (ref 135–145)
Sodium: 137 mmol/L (ref 135–145)
Sodium: 142 mmol/L (ref 135–145)
TCO2: 25 mmol/L (ref 0–100)
TCO2: 25 mmol/L (ref 0–100)
TCO2: 26 mmol/L (ref 0–100)
TCO2: 26 mmol/L (ref 0–100)
TCO2: 24 mmol/L (ref 0–100)
TCO2: 27 mmol/L (ref 0–100)
TCO2: 21 mmol/L (ref 0–100)

## 2016-08-19 LAB — POCT I-STAT 3, ART BLOOD GAS (G3+)
Acid-base deficit: 1 mmol/L (ref 0.0–2.0)
Acid-base deficit: 2 mmol/L (ref 0.0–2.0)
Acid-base deficit: 3 mmol/L — ABNORMAL HIGH (ref 0.0–2.0)
Acid-base deficit: 5 mmol/L — ABNORMAL HIGH (ref 0.0–2.0)
BICARBONATE: 22.3 mmol/L (ref 20.0–28.0)
Bicarbonate: 24.7 mmol/L (ref 20.0–28.0)
Bicarbonate: 25.6 mmol/L (ref 20.0–28.0)
Bicarbonate: 19.6 mmol/L — ABNORMAL LOW (ref 20.0–28.0)
O2 SAT: 100 %
O2 SAT: 100 %
O2 SAT: 100 %
O2 Saturation: 100 %
PCO2 ART: 32.1 mmHg (ref 32.0–48.0)
PH ART: 7.336 — AB (ref 7.350–7.450)
PH ART: 7.39 (ref 7.350–7.450)
PO2 ART: 177 mmHg — AB (ref 83.0–108.0)
TCO2: 26 mmol/L (ref 0–100)
TCO2: 27 mmol/L (ref 0–100)
TCO2: 24 mmol/L (ref 0–100)
TCO2: 21 mmol/L (ref 0–100)
pCO2 arterial: 46.3 mmHg (ref 32.0–48.0)
pCO2 arterial: 56.4 mmHg — ABNORMAL HIGH (ref 32.0–48.0)
pCO2 arterial: 38.4 mmHg (ref 32.0–48.0)
pH, Arterial: 7.265 — ABNORMAL LOW (ref 7.350–7.450)
pH, Arterial: 7.369 (ref 7.350–7.450)
pO2, Arterial: 430 mmHg — ABNORMAL HIGH (ref 83.0–108.0)
pO2, Arterial: 517 mmHg — ABNORMAL HIGH (ref 83.0–108.0)
pO2, Arterial: 166 mmHg — ABNORMAL HIGH (ref 83.0–108.0)

## 2016-08-19 LAB — PROTIME-INR
INR: 1.46
PROTHROMBIN TIME: 17.9 s — AB (ref 11.4–15.2)

## 2016-08-19 LAB — CBC
HEMATOCRIT: 32.7 % — AB (ref 39.0–52.0)
HEMATOCRIT: 32.6 % — AB (ref 39.0–52.0)
HEMOGLOBIN: 11.2 g/dL — AB (ref 13.0–17.0)
Hemoglobin: 11.1 g/dL — ABNORMAL LOW (ref 13.0–17.0)
MCH: 30.7 pg (ref 26.0–34.0)
MCH: 30.3 pg (ref 26.0–34.0)
MCHC: 34.3 g/dL (ref 30.0–36.0)
MCHC: 34 g/dL (ref 30.0–36.0)
MCV: 89.6 fL (ref 78.0–100.0)
MCV: 89.1 fL (ref 78.0–100.0)
PLATELETS: 112 10*3/uL — AB (ref 150–400)
Platelets: 110 10*3/uL — ABNORMAL LOW (ref 150–400)
RBC: 3.65 MIL/uL — ABNORMAL LOW (ref 4.22–5.81)
RBC: 3.66 MIL/uL — ABNORMAL LOW (ref 4.22–5.81)
RDW: 14.5 % (ref 11.5–15.5)
RDW: 14.5 % (ref 11.5–15.5)
WBC: 10.4 10*3/uL (ref 4.0–10.5)
WBC: 10.3 10*3/uL (ref 4.0–10.5)

## 2016-08-19 LAB — APTT: aPTT: 35 seconds (ref 24–36)

## 2016-08-19 LAB — HEMOGLOBIN AND HEMATOCRIT, BLOOD
HCT: 30.9 % — ABNORMAL LOW (ref 39.0–52.0)
Hemoglobin: 10.3 g/dL — ABNORMAL LOW (ref 13.0–17.0)

## 2016-08-19 LAB — PLATELET COUNT: Platelets: 118 10*3/uL — ABNORMAL LOW (ref 150–400)

## 2016-08-19 LAB — POCT I-STAT 4, (NA,K, GLUC, HGB,HCT)
Glucose, Bld: 55 mg/dL — ABNORMAL LOW (ref 65–99)
HCT: 33 % — ABNORMAL LOW (ref 39.0–52.0)
HEMOGLOBIN: 11.2 g/dL — AB (ref 13.0–17.0)
POTASSIUM: 3.6 mmol/L (ref 3.5–5.1)
Sodium: 141 mmol/L (ref 135–145)

## 2016-08-19 LAB — MAGNESIUM: Magnesium: 2.8 mg/dL — ABNORMAL HIGH (ref 1.7–2.4)

## 2016-08-19 LAB — CREATININE, SERUM
Creatinine, Ser: 0.85 mg/dL (ref 0.61–1.24)
GFR calc non Af Amer: >60 mL/min (ref >60)

## 2016-08-19 LAB — FIBRINOGEN
FIBRINOGEN: 209 mg/dL — AB (ref 210–475)
Fibrinogen: 223 mg/dL (ref 210–475)

## 2016-08-19 SURGERY — BENTALL PROCEDURE
Anesthesia: General | Site: Chest

## 2016-08-19 MED ORDER — ASPIRIN EC 325 MG PO TBEC
325.0000 mg | DELAYED_RELEASE_TABLET | Freq: Every day | ORAL | Status: DC
  Administered 2016-08-20 – 2016-08-22 (×3): 325 mg via ORAL
  Filled 2016-08-19 (×3): qty 1

## 2016-08-19 MED ORDER — METOPROLOL TARTRATE 12.5 MG HALF TABLET: 12.5000 mg | ORAL_TABLET | Freq: Once | ORAL | Status: DC

## 2016-08-19 MED ORDER — ACETAMINOPHEN 160 MG/5ML PO SOLN: 1000.0000 mg | Freq: Four times a day (QID) | ORAL | Status: DC

## 2016-08-19 MED ORDER — POLYVINYL ALCOHOL 1.4 % OP SOLN
1.0000 [drp] | Freq: Four times a day (QID) | OPHTHALMIC | Status: DC | PRN
  Filled 2016-08-19: qty 15

## 2016-08-19 MED ORDER — ALBUMIN HUMAN 5 % IV SOLN
INTRAVENOUS | Status: DC | PRN
  Administered 2016-08-19 (×3): via INTRAVENOUS

## 2016-08-19 MED ORDER — ACETAMINOPHEN 650 MG RE SUPP
650.0000 mg | Freq: Once | RECTAL | Status: AC
  Administered 2016-08-19: 650 mg via RECTAL

## 2016-08-19 MED ORDER — BISACODYL 5 MG PO TBEC
10.0000 mg | DELAYED_RELEASE_TABLET | Freq: Every day | ORAL | Status: DC
  Administered 2016-08-20 – 2016-08-23 (×3): 10 mg via ORAL
  Filled 2016-08-19 (×4): qty 2

## 2016-08-19 MED ORDER — PROPOFOL 10 MG/ML IV BOLUS
INTRAVENOUS | Status: AC
  Filled 2016-08-19: qty 20

## 2016-08-19 MED ORDER — MORPHINE SULFATE (PF) 2 MG/ML IV SOLN
2.0000 mg | INTRAVENOUS | Status: DC | PRN
  Administered 2016-08-19 – 2016-08-20 (×4): 4 mg via INTRAVENOUS
  Filled 2016-08-19 (×4): qty 2

## 2016-08-19 MED ORDER — CHLORHEXIDINE GLUCONATE 4 % EX LIQD: 30.0000 mL | CUTANEOUS | Status: DC

## 2016-08-19 MED ORDER — METHYLPREDNISOLONE SODIUM SUCC 125 MG IJ SOLR
INTRAMUSCULAR | Status: DC | PRN
  Administered 2016-08-19: 125 mg via INTRAVENOUS

## 2016-08-19 MED ORDER — LIDOCAINE 2% (20 MG/ML) 5 ML SYRINGE
INTRAMUSCULAR | Status: AC
  Filled 2016-08-19: qty 5

## 2016-08-19 MED ORDER — METOPROLOL TARTRATE 12.5 MG HALF TABLET: 12.5000 mg | ORAL_TABLET | Freq: Two times a day (BID) | ORAL | Status: DC

## 2016-08-19 MED ORDER — SODIUM CHLORIDE 0.45 % IV SOLN
INTRAVENOUS | Status: DC | PRN
  Administered 2016-08-19: 15:00:00 via INTRAVENOUS

## 2016-08-19 MED ORDER — DEXTROSE 5 % IV SOLN
1.5000 g | Freq: Two times a day (BID) | INTRAVENOUS | Status: AC
  Administered 2016-08-19 – 2016-08-21 (×4): 1.5 g via INTRAVENOUS
  Filled 2016-08-19 (×4): qty 1.5

## 2016-08-19 MED ORDER — BISACODYL 10 MG RE SUPP: 10.0000 mg | Freq: Every day | RECTAL | Status: DC

## 2016-08-19 MED ORDER — MAGNESIUM SULFATE 4 GM/100ML IV SOLN
4.0000 g | Freq: Once | INTRAVENOUS | Status: AC
  Administered 2016-08-19: 4 g via INTRAVENOUS
  Filled 2016-08-19: qty 100

## 2016-08-19 MED ORDER — CHLORHEXIDINE GLUCONATE 0.12 % MT SOLN
15.0000 mL | Freq: Once | OROMUCOSAL | Status: AC
  Administered 2016-08-19: 15 mL via OROMUCOSAL
  Filled 2016-08-19: qty 15

## 2016-08-19 MED ORDER — SODIUM CHLORIDE 0.9 % IV SOLN: 250.0000 mL | INTRAVENOUS | Status: DC

## 2016-08-19 MED ORDER — ASPIRIN 81 MG PO CHEW: 324.0000 mg | CHEWABLE_TABLET | Freq: Every day | ORAL | Status: DC

## 2016-08-19 MED ORDER — CHLORHEXIDINE GLUCONATE 0.12% ORAL RINSE (MEDLINE KIT)
15.0000 mL | Freq: Two times a day (BID) | OROMUCOSAL | Status: DC
  Administered 2016-08-19 – 2016-08-20 (×3): 15 mL via OROMUCOSAL

## 2016-08-19 MED ORDER — TRAMADOL HCL 50 MG PO TABS
50.0000 mg | ORAL_TABLET | ORAL | Status: DC | PRN
  Administered 2016-08-21 – 2016-08-23 (×4): 100 mg via ORAL
  Filled 2016-08-19 (×4): qty 2

## 2016-08-19 MED ORDER — MIDAZOLAM HCL 10 MG/2ML IJ SOLN
INTRAMUSCULAR | Status: AC
  Filled 2016-08-19: qty 2

## 2016-08-19 MED ORDER — LACTATED RINGERS IV SOLN
INTRAVENOUS | Status: DC | PRN
  Administered 2016-08-19: 07:00:00 via INTRAVENOUS

## 2016-08-19 MED ORDER — ACETAMINOPHEN 500 MG PO TABS
1000.0000 mg | ORAL_TABLET | Freq: Four times a day (QID) | ORAL | Status: DC
  Administered 2016-08-20 – 2016-08-23 (×9): 1000 mg via ORAL
  Filled 2016-08-19 (×8): qty 2

## 2016-08-19 MED ORDER — PROTAMINE SULFATE 10 MG/ML IV SOLN
INTRAVENOUS | Status: AC
  Filled 2016-08-19: qty 25

## 2016-08-19 MED ORDER — LACTATED RINGERS IV SOLN
INTRAVENOUS | Status: DC | PRN
  Administered 2016-08-19 (×2): via INTRAVENOUS

## 2016-08-19 MED ORDER — ALBUMIN HUMAN 5 % IV SOLN
250.0000 mL | INTRAVENOUS | Status: AC | PRN
Start: 2016-08-19 — End: 2016-08-20
  Administered 2016-08-19: 250 mL via INTRAVENOUS

## 2016-08-19 MED ORDER — DEXTROSE 50 % IV SOLN
INTRAVENOUS | Status: AC
  Filled 2016-08-19: qty 50

## 2016-08-19 MED ORDER — ARTIFICIAL TEARS OP OINT
TOPICAL_OINTMENT | OPHTHALMIC | Status: AC
  Filled 2016-08-19: qty 3.5

## 2016-08-19 MED ORDER — INSULIN REGULAR BOLUS VIA INFUSION
0.0000 [IU] | Freq: Three times a day (TID) | INTRAVENOUS | Status: DC
  Filled 2016-08-19: qty 10

## 2016-08-19 MED ORDER — HEPARIN SODIUM (PORCINE) 1000 UNIT/ML IJ SOLN
INTRAMUSCULAR | Status: DC | PRN
  Administered 2016-08-19: 32000 [IU] via INTRAVENOUS

## 2016-08-19 MED ORDER — ROCURONIUM BROMIDE 100 MG/10ML IV SOLN
INTRAVENOUS | Status: DC | PRN
  Administered 2016-08-19 (×5): 50 mg via INTRAVENOUS

## 2016-08-19 MED ORDER — POTASSIUM CHLORIDE 2 MEQ/ML IV SOLN
30.0000 meq | Freq: Once | INTRAVENOUS | Status: AC
  Administered 2016-08-19: 30 meq via INTRAVENOUS
  Filled 2016-08-19: qty 15

## 2016-08-19 MED ORDER — PHENYLEPHRINE HCL 10 MG/ML IJ SOLN
0.0000 ug/min | INTRAVENOUS | Status: DC
  Administered 2016-08-20: 25 ug/min via INTRAVENOUS
  Filled 2016-08-19: qty 2

## 2016-08-19 MED ORDER — LORATADINE 10 MG PO TABS
10.0000 mg | ORAL_TABLET | Freq: Every day | ORAL | Status: DC
  Administered 2016-08-20 – 2016-08-23 (×4): 10 mg via ORAL
  Filled 2016-08-19 (×4): qty 1

## 2016-08-19 MED ORDER — FAMOTIDINE IN NACL 20-0.9 MG/50ML-% IV SOLN
20.0000 mg | Freq: Two times a day (BID) | INTRAVENOUS | Status: AC
  Administered 2016-08-19: 20 mg via INTRAVENOUS

## 2016-08-19 MED ORDER — PROTAMINE SULFATE 10 MG/ML IV SOLN
INTRAVENOUS | Status: DC | PRN
  Administered 2016-08-19: 350 mg via INTRAVENOUS

## 2016-08-19 MED ORDER — HEPARIN SODIUM (PORCINE) 1000 UNIT/ML IJ SOLN
INTRAMUSCULAR | Status: AC
  Filled 2016-08-19: qty 1

## 2016-08-19 MED ORDER — PANTOPRAZOLE SODIUM 40 MG PO TBEC
40.0000 mg | DELAYED_RELEASE_TABLET | Freq: Every day | ORAL | Status: DC
Start: 2016-08-20 — End: 2016-08-23
  Administered 2016-08-20 – 2016-08-23 (×4): 40 mg via ORAL
  Filled 2016-08-19 (×4): qty 1

## 2016-08-19 MED ORDER — SODIUM CHLORIDE 0.9% FLUSH: 3.0000 mL | INTRAVENOUS | Status: DC | PRN

## 2016-08-19 MED ORDER — DEXTROSE 50 % IV SOLN
18.0000 mL | Freq: Once | INTRAVENOUS | Status: AC
Start: 2016-08-19 — End: 2016-08-19
  Administered 2016-08-19: 18 mL via INTRAVENOUS

## 2016-08-19 MED ORDER — LIDOCAINE HCL (CARDIAC) 20 MG/ML IV SOLN
INTRAVENOUS | Status: DC | PRN
  Administered 2016-08-19: 100 mg via INTRAVENOUS

## 2016-08-19 MED ORDER — LACTATED RINGERS IV SOLN: 500.0000 mL | Freq: Once | INTRAVENOUS | Status: DC | PRN

## 2016-08-19 MED ORDER — ORAL CARE MOUTH RINSE
15.0000 mL | Freq: Four times a day (QID) | OROMUCOSAL | Status: DC
  Administered 2016-08-19 – 2016-08-21 (×3): 15 mL via OROMUCOSAL

## 2016-08-19 MED ORDER — DEXMEDETOMIDINE HCL IN NACL 200 MCG/50ML IV SOLN
0.4000 ug/kg/h | INTRAVENOUS | Status: DC
  Filled 2016-08-19: qty 50

## 2016-08-19 MED ORDER — SODIUM CHLORIDE 0.9 % IV SOLN
INTRAVENOUS | Status: DC
  Filled 2016-08-19: qty 2.5

## 2016-08-19 MED ORDER — MIDAZOLAM HCL 2 MG/2ML IJ SOLN
2.0000 mg | INTRAMUSCULAR | Status: DC | PRN
Start: 2016-08-19 — End: 2016-08-20

## 2016-08-19 MED ORDER — MIDAZOLAM HCL 5 MG/5ML IJ SOLN
INTRAMUSCULAR | Status: DC | PRN
  Administered 2016-08-19: 5 mg via INTRAVENOUS
  Administered 2016-08-19: 1 mg via INTRAVENOUS
  Administered 2016-08-19: 2 mg via INTRAVENOUS
  Administered 2016-08-19 (×2): 1 mg via INTRAVENOUS

## 2016-08-19 MED ORDER — PANTOPRAZOLE SODIUM 40 MG PO TBEC: 40.0000 mg | DELAYED_RELEASE_TABLET | Freq: Every day | ORAL | Status: DC

## 2016-08-19 MED ORDER — PHENYLEPHRINE HCL 10 MG/ML IJ SOLN
INTRAMUSCULAR | Status: DC | PRN
  Administered 2016-08-19 (×3): 40 ug via INTRAVENOUS

## 2016-08-19 MED ORDER — NITROGLYCERIN IN D5W 200-5 MCG/ML-% IV SOLN: 0.0000 ug/min | INTRAVENOUS | Status: DC

## 2016-08-19 MED ORDER — HEMOSTATIC AGENTS (NO CHARGE) OPTIME
TOPICAL | Status: DC | PRN
  Administered 2016-08-19: 1 via TOPICAL

## 2016-08-19 MED ORDER — MORPHINE SULFATE (PF) 2 MG/ML IV SOLN: 1.0000 mg | INTRAVENOUS | Status: AC | PRN

## 2016-08-19 MED ORDER — ROCURONIUM BROMIDE 50 MG/5ML IV SOSY
PREFILLED_SYRINGE | INTRAVENOUS | Status: AC
  Filled 2016-08-19: qty 20

## 2016-08-19 MED ORDER — METOPROLOL TARTRATE 5 MG/5ML IV SOLN: 2.5000 mg | INTRAVENOUS | Status: DC | PRN

## 2016-08-19 MED ORDER — FENTANYL CITRATE (PF) 250 MCG/5ML IJ SOLN
INTRAMUSCULAR | Status: DC | PRN
  Administered 2016-08-19: 100 ug via INTRAVENOUS
  Administered 2016-08-19: 50 ug via INTRAVENOUS
  Administered 2016-08-19: 150 ug via INTRAVENOUS
  Administered 2016-08-19: 100 ug via INTRAVENOUS
  Administered 2016-08-19: 150 ug via INTRAVENOUS
  Administered 2016-08-19: 50 ug via INTRAVENOUS
  Administered 2016-08-19 (×2): 100 ug via INTRAVENOUS
  Administered 2016-08-19: 150 ug via INTRAVENOUS
  Administered 2016-08-19: 100 ug via INTRAVENOUS
  Administered 2016-08-19: 50 ug via INTRAVENOUS

## 2016-08-19 MED ORDER — INSULIN ASPART 100 UNIT/ML ~~LOC~~ SOLN: 0.0000 [IU] | SUBCUTANEOUS | Status: DC

## 2016-08-19 MED ORDER — OXYCODONE HCL 5 MG PO TABS
5.0000 mg | ORAL_TABLET | ORAL | Status: DC | PRN
  Administered 2016-08-19 – 2016-08-20 (×4): 5 mg via ORAL
  Filled 2016-08-19 (×5): qty 1

## 2016-08-19 MED ORDER — SODIUM CHLORIDE 0.9 % IV SOLN: INTRAVENOUS | Status: DC

## 2016-08-19 MED ORDER — DOCUSATE SODIUM 100 MG PO CAPS
200.0000 mg | ORAL_CAPSULE | Freq: Every day | ORAL | Status: DC
Start: 2016-08-20 — End: 2016-08-23
  Administered 2016-08-20 – 2016-08-23 (×4): 200 mg via ORAL
  Filled 2016-08-19 (×4): qty 2

## 2016-08-19 MED ORDER — VANCOMYCIN HCL IN DEXTROSE 1-5 GM/200ML-% IV SOLN
1000.0000 mg | Freq: Once | INTRAVENOUS | Status: AC
  Administered 2016-08-19: 1000 mg via INTRAVENOUS
  Filled 2016-08-19: qty 200

## 2016-08-19 MED ORDER — LACTATED RINGERS IV SOLN
INTRAVENOUS | Status: DC | PRN
  Administered 2016-08-19 (×2): via INTRAVENOUS

## 2016-08-19 MED ORDER — METOPROLOL TARTRATE 25 MG/10 ML ORAL SUSPENSION: 12.5000 mg | Freq: Two times a day (BID) | ORAL | Status: DC

## 2016-08-19 MED ORDER — ETOMIDATE 2 MG/ML IV SOLN
INTRAVENOUS | Status: DC | PRN
  Administered 2016-08-19: 20 mg via INTRAVENOUS

## 2016-08-19 MED ORDER — ACETAMINOPHEN 160 MG/5ML PO SOLN: 650.0000 mg | Freq: Once | ORAL | Status: AC

## 2016-08-19 MED ORDER — SODIUM CHLORIDE 0.9% FLUSH
3.0000 mL | Freq: Two times a day (BID) | INTRAVENOUS | Status: DC
  Administered 2016-08-20 – 2016-08-22 (×4): 3 mL via INTRAVENOUS

## 2016-08-19 MED ORDER — PHENYLEPHRINE 40 MCG/ML (10ML) SYRINGE FOR IV PUSH (FOR BLOOD PRESSURE SUPPORT)
PREFILLED_SYRINGE | INTRAVENOUS | Status: AC
  Filled 2016-08-19: qty 10

## 2016-08-19 MED ORDER — DEXMEDETOMIDINE HCL IN NACL 200 MCG/50ML IV SOLN
0.0000 ug/kg/h | INTRAVENOUS | Status: DC
  Administered 2016-08-19: 0.2 ug/kg/h via INTRAVENOUS
  Filled 2016-08-19: qty 50

## 2016-08-19 MED ORDER — GABAPENTIN 300 MG PO CAPS
300.0000 mg | ORAL_CAPSULE | Freq: Three times a day (TID) | ORAL | Status: DC
  Administered 2016-08-20 – 2016-08-23 (×10): 300 mg via ORAL
  Filled 2016-08-19 (×10): qty 1

## 2016-08-19 MED ORDER — FLUTICASONE PROPIONATE 50 MCG/ACT NA SUSP
2.0000 | Freq: Every day | NASAL | Status: DC | PRN
  Filled 2016-08-19: qty 16

## 2016-08-19 MED ORDER — ONDANSETRON HCL 4 MG/2ML IJ SOLN: 4.0000 mg | Freq: Four times a day (QID) | INTRAMUSCULAR | Status: DC | PRN

## 2016-08-19 MED ORDER — CHLORHEXIDINE GLUCONATE 0.12 % MT SOLN
15.0000 mL | OROMUCOSAL | Status: AC
  Administered 2016-08-19: 15 mL via OROMUCOSAL
  Filled 2016-08-19: qty 15

## 2016-08-19 MED ORDER — THROMBIN 20000 UNITS EX SOLR
CUTANEOUS | Status: AC
  Filled 2016-08-19: qty 20000

## 2016-08-19 MED ORDER — FENTANYL CITRATE (PF) 250 MCG/5ML IJ SOLN
INTRAMUSCULAR | Status: AC
  Filled 2016-08-19: qty 25

## 2016-08-19 MED ORDER — GELATIN ABSORBABLE MT POWD
OROMUCOSAL | Status: DC | PRN
  Administered 2016-08-19 (×3): 1 mL via TOPICAL

## 2016-08-19 MED ORDER — LACTATED RINGERS IV SOLN
INTRAVENOUS | Status: DC
Start: 2016-08-19 — End: 2016-08-23

## 2016-08-19 MED ORDER — LACTATED RINGERS IV SOLN: INTRAVENOUS | Status: DC

## 2016-08-19 MED ORDER — PROPOFOL 10 MG/ML IV BOLUS
INTRAVENOUS | Status: DC | PRN
  Administered 2016-08-19: 50 mg via INTRAVENOUS
  Administered 2016-08-19: 100 mg via INTRAVENOUS
  Administered 2016-08-19: 40 mg via INTRAVENOUS

## 2016-08-19 MED ORDER — 0.9 % SODIUM CHLORIDE (POUR BTL) OPTIME
TOPICAL | Status: DC | PRN
  Administered 2016-08-19: 6000 mL

## 2016-08-19 MED FILL — Potassium Chloride Inj 2 mEq/ML: INTRAVENOUS | Qty: 40 | Status: AC

## 2016-08-19 MED FILL — Heparin Sodium (Porcine) Inj 1000 Unit/ML: INTRAMUSCULAR | Qty: 30 | Status: AC

## 2016-08-19 MED FILL — Magnesium Sulfate Inj 50%: INTRAMUSCULAR | Qty: 2 | Status: AC

## 2016-08-19 SURGICAL SUPPLY — 97 items
ADAPTER CARDIO PERF ANTE/RETRO (ADAPTER) ×4 IMPLANT
ADPR PRFSN 84XANTGRD RTRGD (ADAPTER) ×2
APL SRG 7X2 LUM MLBL SLNT (VASCULAR PRODUCTS) ×2
APPLICATOR TIP COSEAL (VASCULAR PRODUCTS) ×2 IMPLANT
ATTRACTOMAT 16X20 MAGNETIC DRP (DRAPES) ×4 IMPLANT
BAG DECANTER FOR FLEXI CONT (MISCELLANEOUS) ×4 IMPLANT
BLADE STERNUM SYSTEM 6 (BLADE) ×4 IMPLANT
BLADE SURG 15 STRL LF DISP TIS (BLADE) ×2 IMPLANT
BLADE SURG 15 STRL SS (BLADE) ×4
CANISTER SUCTION 2500CC (MISCELLANEOUS) ×4 IMPLANT
CANNULA ARTERIAL VENT 3/8 20FR (CANNULA) ×2 IMPLANT
CANNULA GUNDRY RCSP 15FR (MISCELLANEOUS) ×4 IMPLANT
CANNULA MC2 2 STG 36/46 NON-V (CANNULA) IMPLANT
CANNULA SUMP PERICARDIAL (CANNULA) ×2 IMPLANT
CANNULA VENOUS 2 STG 34/46 (CANNULA) ×2
CATH ROBINSON RED A/P 18FR (CATHETERS) ×14 IMPLANT
CATH THORACIC 36FR (CATHETERS) ×4 IMPLANT
CATH THORACIC 36FR RT ANG (CATHETERS) ×4 IMPLANT
CAUTERY HIGH TEMP VAS (MISCELLANEOUS) ×4 IMPLANT
CAUTERY SURG HI TEMP FINE TIP (MISCELLANEOUS) ×2 IMPLANT
CONT SPEC 4OZ CLIKSEAL STRL BL (MISCELLANEOUS) ×4 IMPLANT
CONT SPEC STER OR (MISCELLANEOUS) ×4 IMPLANT
COVER MAYO STAND STRL (DRAPES) ×2 IMPLANT
COVER SURGICAL LIGHT HANDLE (MISCELLANEOUS) ×10 IMPLANT
CRADLE DONUT ADULT HEAD (MISCELLANEOUS) ×4 IMPLANT
DRAPE SLUSH/WARMER DISC (DRAPES) IMPLANT
DRSG COVADERM 4X14 (GAUZE/BANDAGES/DRESSINGS) ×4 IMPLANT
ELECT CAUTERY BLADE 6.4 (BLADE) ×4 IMPLANT
ELECT REM PT RETURN 9FT ADLT (ELECTROSURGICAL) ×8
ELECTRODE REM PT RTRN 9FT ADLT (ELECTROSURGICAL) ×4 IMPLANT
FELT TEFLON 1X6 (MISCELLANEOUS) ×8 IMPLANT
GAUZE SPONGE 4X4 12PLY STRL (GAUZE/BANDAGES/DRESSINGS) ×4 IMPLANT
GLOVE BIO SURGEON STRL SZ 6 (GLOVE) IMPLANT
GLOVE BIO SURGEON STRL SZ 6.5 (GLOVE) ×7 IMPLANT
GLOVE BIO SURGEON STRL SZ7 (GLOVE) IMPLANT
GLOVE BIO SURGEON STRL SZ7.5 (GLOVE) IMPLANT
GLOVE BIO SURGEONS STRL SZ 6.5 (GLOVE) ×7
GLOVE EUDERMIC 7 POWDERFREE (GLOVE) ×8 IMPLANT
GOWN STRL REUS W/ TWL LRG LVL3 (GOWN DISPOSABLE) ×8 IMPLANT
GOWN STRL REUS W/ TWL XL LVL3 (GOWN DISPOSABLE) ×2 IMPLANT
GOWN STRL REUS W/TWL LRG LVL3 (GOWN DISPOSABLE) ×16
GOWN STRL REUS W/TWL XL LVL3 (GOWN DISPOSABLE) ×4
GRAFT GELWEAVE VALSALVA 28 (Prosthesis & Implant Heart) IMPLANT
GRAFT GELWEAVE VALSALVA 28CM (Prosthesis & Implant Heart) ×4 IMPLANT
GRAFT HEMASHIELD 28X40 (Vascular Products) ×4 IMPLANT
GRAFT HEMASHIELD 28X50 (Vascular Products) IMPLANT
HEART VENT LT CURVED (MISCELLANEOUS) ×4 IMPLANT
HEMOSTAT POWDER SURGIFOAM 1G (HEMOSTASIS) ×12 IMPLANT
HEMOSTAT SURGICEL 2X14 (HEMOSTASIS) ×4 IMPLANT
KIT BASIN OR (CUSTOM PROCEDURE TRAY) ×4 IMPLANT
KIT CATH CPB BARTLE (MISCELLANEOUS) ×4 IMPLANT
KIT ROOM TURNOVER OR (KITS) ×4 IMPLANT
KIT SUCTION CATH 14FR (SUCTIONS) ×4 IMPLANT
LINE VENT (MISCELLANEOUS) ×2 IMPLANT
LOOP VESSEL SUPERMAXI WHITE (MISCELLANEOUS) ×2 IMPLANT
NS IRRIG 1000ML POUR BTL (IV SOLUTION) ×22 IMPLANT
PACK OPEN HEART (CUSTOM PROCEDURE TRAY) ×4 IMPLANT
PAD ARMBOARD 7.5X6 YLW CONV (MISCELLANEOUS) ×8 IMPLANT
SEALANT SURG COSEAL 8ML (VASCULAR PRODUCTS) IMPLANT
SENSOR MYOCARDIAL TEMP (MISCELLANEOUS) ×2 IMPLANT
SET CARDIOPLEGIA MPS 5001102 (MISCELLANEOUS) ×2 IMPLANT
SET VEIN GRAFT PERF (SET/KITS/TRAYS/PACK) ×2 IMPLANT
SPONGE GAUZE 4X4 12PLY STER LF (GAUZE/BANDAGES/DRESSINGS) ×2 IMPLANT
SUT BONE WAX W31G (SUTURE) ×4 IMPLANT
SUT ETHIBON 2 0 V 52N 30 (SUTURE) ×12 IMPLANT
SUT ETHIBON EXCEL 2-0 V-5 (SUTURE) IMPLANT
SUT ETHIBOND V-5 VALVE (SUTURE) IMPLANT
SUT PROLENE 3 0 SH 1 (SUTURE) ×4 IMPLANT
SUT PROLENE 3 0 SH 48 (SUTURE) ×8 IMPLANT
SUT PROLENE 3 0 SH DA (SUTURE) IMPLANT
SUT PROLENE 4 0 RB 1 (SUTURE) ×24
SUT PROLENE 4-0 RB1 .5 CRCL 36 (SUTURE) ×8 IMPLANT
SUT PROLENE 5 0 C 1 36 (SUTURE) ×6 IMPLANT
SUT PROLENE 5 0 RB 2 (SUTURE) ×8 IMPLANT
SUT SILK 2 0 SH CR/8 (SUTURE) ×2 IMPLANT
SUT STEEL 6MS V (SUTURE) IMPLANT
SUT STEEL STERNAL CCS#1 18IN (SUTURE) IMPLANT
SUT STEEL SZ 6 DBL 3X14 BALL (SUTURE) ×6 IMPLANT
SUT VIC AB 1 CTX 36 (SUTURE) ×12
SUT VIC AB 1 CTX36XBRD ANBCTR (SUTURE) ×4 IMPLANT
SUT VIC AB 2-0 CT1 27 (SUTURE)
SUT VIC AB 2-0 CT1 TAPERPNT 27 (SUTURE) IMPLANT
SUT VIC AB 3-0 X1 27 (SUTURE) IMPLANT
SUTURE E-PAK OPEN HEART (SUTURE) ×4 IMPLANT
SYS ARTICLIP LAA EXCLUSION 140 (Clip) ×4 IMPLANT
SYSTEM SAHARA CHEST DRAIN ATS (WOUND CARE) ×4 IMPLANT
TAPE PAPER 2X10 WHT MICROPORE (GAUZE/BANDAGES/DRESSINGS) ×2 IMPLANT
TOWEL OR 17X24 6PK STRL BLUE (TOWEL DISPOSABLE) ×4 IMPLANT
TOWEL OR 17X26 10 PK STRL BLUE (TOWEL DISPOSABLE) ×4 IMPLANT
TRAY FOLEY IC TEMP SENS 16FR (CATHETERS) ×2 IMPLANT
TUBE CONNECTING 20'X1/4 (TUBING) ×1
TUBE CONNECTING 20X1/4 (TUBING) ×1 IMPLANT
TUBE SUCTION CARDIAC 10FR (CANNULA) ×2 IMPLANT
UNDERPAD 30X30 (UNDERPADS AND DIAPERS) ×4 IMPLANT
VALVE MAGNA EASE AORTIC 25MM (Prosthesis & Implant Heart) ×2 IMPLANT
WATER STERILE IRR 1000ML POUR (IV SOLUTION) ×8 IMPLANT
YANKAUER SUCT BULB TIP NO VENT (SUCTIONS) ×2 IMPLANT

## 2016-08-19 NOTE — Interval H&P Note (Signed)
History and Physical Interval Note:  08/19/2016 5:45 AM  Clinton Pearson  has presented today for surgery, with the diagnosis of TAA AI  The various methods of treatment have been discussed with the patient and family. After consideration of risks, benefits and other options for treatment, the patient has consented to  Procedure(s): BENTALL PROCEDURE WITH CIRC ARREST (N/A) REPLACEMENT ASCENDING AORTA (N/A) CLIPPING OF ATRIAL APPENDAGE (N/A) TRANSESOPHAGEAL ECHOCARDIOGRAM (TEE) (N/A) as a surgical intervention .  The patient's history has been reviewed, patient examined, no change in status, stable for surgery.  I have reviewed the patient's chart and labs.  Questions were answered to the patient's satisfaction.     Gaye Pollack

## 2016-08-19 NOTE — Op Note (Signed)
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 Median Sternotomy 2. Extracorporeal circulation 3.   Replacement of the ascending aorta (hemi-arch) using a 28 mm Hemashield graft under deep hypothermic circulatory arrest 4.   Biological Bentall Procedure using a 25 mm Edwards Magna-Ease pericardial valve and a 28 mm Gelweave Valsalva graft. 5.  Placement of 35 mm Atricure left atrial clip.  Anesthesia:  General Endotracheal   Clinical History/Surgical Indication:  The patient is a 66 year old gentleman who presented with a pulmonary embolism in October 2016 thought to be related to DVT from sleeping in a recliner with his legs hanging down because he developed marked leg edema that suddenly resolved on the right side about the time he suffered the PE. Venous studies reportedly never showed a DVT. He was started on Xarelto. He had a sister who had a factory II abnormality and multiple PE's. He was treated with Xarelto for 6 months and it was stopped. 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  He had a CT of the chest in August 2016 that showed an ascending aortic aneurysm with a maximum diameter of 4.4 cm at the level of the right PA. He also had an echo in September 2016 that showed a dilated aortic root with a sinus of valsalva diameter of 4.9 cm with moderate AI. A repeat echo on 10/28/2015 showed no change in the aorta with a sinus diameter of 4.9 cm, an STJ diameter of 4.8 cm and an ascending aorta diameter of 4.2 cm. There was a trileaflet aortic valve with moderate AI with a PHT of 430 ms. The LVEF was 55-60% with normal LV internal dimensions.   I saw him initially in June 2017 and since then he has lost a lot  of weight by eating a strict, mostly vegetable diet and not eating any sweets. His main complaints are related to DJD of the spine and arthritis and not being able to take NSAIDS due to being on Xarelto. He had a rhythm monitor on for a week in October and had a few brief runs of atrial flutter and atrial fibrillation and was restarted on Xarelto when he saw his cardiologist, Dr. Boyd Kerbs, on Nov 1. 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 He had a 2D echo on 04/29/2016 that showed an aortic sinus diameter of 5.0 cm with an STJ of 3.7 cm and ascending aortic diameter of 4.3 cm. LV internal dimensions were within the normal range. EF was 50-55% with mild LVH and stage 1 diastolic dysfuction. The aortic valve was trileaflet with severe AI. The PHT was recorded as 435 ms. There was no MR. He had a nuclear stress test on 06/07/2016 that was negative for ischemia with an EF of 55%. He has a cardiac cath on 08/11/2016 that showed no coronary disease. There were separate ostia of the LAD and LCX coronary arteries.  In summary, Mr. Clinton Pearson has an aortic root and ascending aortic aneurysm with further enlargement of the sinus diameter on his CTA to 5.4 cm. He has moderate to severe AI by echo with a diastolic murmur on exam. I think this  aneurysm and AI needs to be repaired to prevent an aortic dissection and progressive deterioration of LV function due to severe AI. I would plan to replace the aortic root and ascending aorta up to the innominate artery which is where the aorta decreases to more normal size of 3.2 cm. I don't think his aortic valve will be suitable for a valve-sparing root replacement given the size of the annulus and the degree of AI. The leaflets are usually elongated and fenestrated at the commissures and long term valve competency is reduced. I discussed use of mechanical and bioprosthetic valves with him and his wife. He would  need to be on life-long coumadin with a mechanical valve which he is not interested in. Given his age of 66 a bovine pericardial valve would be a good choice with a low risk of structural valve deterioration in his lifetime and no need for coumadin. He is in agreement with that. He is currently on Xarelto for possible brief episodes of atrial fibrillation and atrial flutter. If he has a prosthetic valve in place then Xarelto is not approved for valvular atrial fibrillation at this time and he may need to be on Coumadin anyway, although there is no absolute indication for it as far as the valve is concerned. I would plan to place a clip on the left atrial appendage to decrease the risk of left atrial thrombus and that may allow him to avoid anticoagulation. I discussed the operative procedure with the patient and his wifeincluding alternatives, benefits and risks; including but not limited to bleeding, blood transfusion, infection, stroke, myocardial infarction, structural valve deterioration, heart block requiring a permanent pacemaker, organ dysfunction, and death. Kain Moreaux Osborneunderstands and agrees to proceed.   Plan:  Bioprosthetic Bentall procedure with replacement of the ascending aortic aneurysm using circulatory arrest, clipping of the left atrial appendage.   Preparation:  The patient was seen in the preoperative holding area and the correct patient, correct operation were confirmed with the patient after reviewing the medical record and catheterization. The consent was signed by me. Preoperative antibiotics were given. A pulmonary arterial line and radial arterial line were placed by the anesthesia team. The patient was taken back to the operating room and positioned supine on the operating room table. After being placed under general endotracheal anesthesia by the anesthesia team a foley catheter was placed. The neck, chest, abdomen, and both legs were prepped with betadine soap and  solution and draped in the usual sterile manner. A surgical time-out was taken and the correct patient and operative procedure were confirmed with the nursing and anesthesia staff.  TEE:  Performed by Dr. Finis Bud. This showed a large aortic root aneurysm, severe AI and normal LV function.    Cardiopulmonary Bypass:  A median sternotomy was performed. The pericardium was opened in the midline. Right ventricular function appeared normal. The ascending aorta was of normal size and had no palpable plaque. There were no contraindications to aortic cannulation or cross-clamping. The patient was fully systemically heparinized and the ACT was maintained > 400 sec. The distal ascending aorta was cannulated with a 20 F aortic cannula for arterial inflow. Venous cannulation was performed via the right atrial appendage using a two-staged venous cannula. Aortic occlusion was performed with a single cross-clamp. Systemic cooling to 18 degrees Centigrade and topical cooling of the heart with iced saline were used. Hyperkalemic retrograde cold blood cardioplegia was used to induce diastolic arrest and was then given both into the coronary  ostia using a handheld cannula and retrograde at about 20 minute intervals throughout the period of arrest to maintain myocardial temperature at or below 10 degrees centigrade. A temperature probe was inserted into the interventricular septum and an insulating pad was placed in the pericardium. CO2 was insufflated into the pericardium throughout the case to minimize intracardiac air.    Resection and grafting of ascending aortic aneurysm:  The patient was placed on cardiopulmonary bypass and a left ventricular vent was placed via the right superior pulmonary vein. Systemic cooling was begun with a goal temperature of 18 degrees centigrade by bladder and rectal temperature probes. A retrograde cardioplegia cannula was placed through the right atrium into the coronary sinus  without difficulty. A retrograde cerebral perfusion cannula was placed into the SVC through a pursestring suture and the SVC was encircled with a silastic tape.   After 30 minutes of cooling the target temperature of 18 degrees centigrade was reached. Cerebral oximetry was 70-80% bilaterally. BIS was zero. The patient was given 20 mg of Etomidate and 125 mg of Solumedrol. The head was packed in ice. The bed was placed in steep trendelenburg. Circulatory arrest was begun and the blood volume emptied into the venous reservoir. Continuous retrograde cerebraplegia was begun and the SVC occluded with the silastic tape. Cold blood retrograde cardioplegia and direct coronary ostial cardioplegia were given and myocardial temperature dropped to 10 degrees centigrade. Additional doses were given at approximately 20 minute intervals throughout the period of circulatory arrest and cross-clamping. Complete diastolic arrest was maintained. The aortic cannula was removed. The aorta was transected just proximal to the innominate artery beveling the resection out along the undersurface of the aortic arch (Hemiarch replacement). The aortic diameter was measured at 28 mm here. A 28 x 10 mm Hemasheild Platinum vascular graft was prepared. ( Catalog # [id number], Lot [id number]). It was anastomosed to the aortic arch in an end to end manner using 3-0 prolene continuous suture with a felt strip to reinforce the anastomisis. A light coating of CoSeal was applied to seal needle holes. The arterial end of the bypass circuit was then connected to the 90mm side arm graft and circulation was slowly resumed. The tape was removed from the SVC. The aortic graft was cross-clamped proximal to the side arm graft and full CPB support was resumed. Circulatory arrest time was 29 minutes. Retrograde cerebral perfusion time was 23 minutes.   Bentall Procedure:   The ascending aorta was mobilized from the right pulmonary artery and main PA. It was  opened longitudinally and the valve inspected. It was a trileaflet valve with elongation of the leaflets with fenestrations at the commissures and calcifications along the central edge of the leaflets. This valve was not suitable for repair. The right and left coronary arteries were removed from the aortic root with a button of aortic wall around the ostia.  They were retracted carefully out of the way with stay sutures to prevent rotation. The native valve was excised taking care to remove all particulate debri. The annulus had no calcium and was thin. The annulus was sized and a 25 mm Edwards Magna-Ease pericardial valve was chosen. ( Model # [id number], Serial # [id number]). A series of pledgetted 2-0 Ethibond horizontal mattress sutures were placed around the annulus with the pledgets in a sub-annular position. A 28 mm Gelweave Valsalva graft was chosen ( Ref # [id number] ADP, Lot # [id number], SN [id number]). The proximal cuff was trimmed so that there were 3 rings remaining. The  valve was placed in the graft so that the sewing cuff was adjacent to the proximal graft cuff. The commissural posts were lined up with the vertical marker sutures on the graft and the valve and graft were held in position using a 6-0 prolene suture at each commissure. The sutures were placed through a strip of autologous pericardium to reinforce the annulus and then the valve sewing ring followed by the proximal graft cuff. The valve was lowered into place and the sutures tied . The valve seated nicely.  Small openings were made in the graft for the coronary anastomoses using a thermal cautery. Then the left and right coronary buttons were anastomosed to the graft in an end to side manner using continuous 5-0 prolene suture. A light coating of CoSeal was applied to each anastomosis for hemostasis. The two grafts were then cut to the appropriate length and anastomosed end to end using continuous 3-0 prolene suture. CoSeal was applied to seal  the needle holes in the grafts. A vent cannula was placed into the graft to remove any air. Deairing maneuvers were performed and the bed placed in trendelenburg position.   Placement of left atrial clip:   The base of the appendage was measured and a small 35 mm Atricure Atriclip was chosen. This was placed across the base of the LAA without difficulty.     Completion:   The patient was rewarmed to 37 degrees Centigrade. The crossclamp was removed with a time of 135 minutes. There was spontaneous return of sinus rhythm. The position of the grafts was satisfactory. The vascular anastomoses all appeared hemostatic. Two temporary epicardial pacing wires were placed on the right atrium and two on the right ventricle. The patient was weaned from CPB without difficulty on no inotropes. CPB time was 209 minutes. Cardiac output was 6 LPM. TEE showed a normal functioning aortic valve prosthesis with no AI. There was trivial MR. LV function appeared normal. Heparin was fully reversed with protamine and the venous cannula removed. The 10 mm aortic side graft was ligated with a 0-silk tie and suture ligated with a 3-0 prolene pledgetted horizontal mattress suture. Hemostasis was achieved. Mediastinal  drainage tubes were placed. The sternum was closed with double #6 stainless steel wires. The fascia was closed with continuous # 1 vicryl suture. The subcutaneous tissue was closed with 2-0 vicryl continuous suture. The skin was closed with 3-0 vicryl subcuticular suture. All sponge, needle, and instrument counts were reported correct at the end of the case. Dry sterile dressings were placed over the incisions and around the chest tubes which were connected to pleurevac suction. The patient was then transported to the surgical intensive care unit in critical but stable condition.

## 2016-08-19 NOTE — Anesthesia Preprocedure Evaluation (Signed)
Anesthesia Evaluation  Patient identified by MRN, date of birth, ID band Patient awake    Reviewed: Allergy & Precautions, NPO status , Patient's Chart, lab work & pertinent test results  History of Anesthesia Complications (+) PONV  Airway Mallampati: II  TM Distance: >3 FB     Dental   Pulmonary neg pulmonary ROS,    breath sounds clear to auscultation       Cardiovascular + Peripheral Vascular Disease  + dysrhythmias  Rhythm:Regular Rate:Normal     Neuro/Psych    GI/Hepatic Neg liver ROS, GERD  ,  Endo/Other  negative endocrine ROS  Renal/GU negative Renal ROS     Musculoskeletal   Abdominal   Peds  Hematology   Anesthesia Other Findings   Reproductive/Obstetrics                             Anesthesia Physical Anesthesia Plan  ASA: III  Anesthesia Plan: General   Post-op Pain Management:    Induction: Intravenous  Airway Management Planned: LMA  Additional Equipment:   Intra-op Plan:   Post-operative Plan:   Informed Consent: I have reviewed the patients History and Physical, chart, labs and discussed the procedure including the risks, benefits and alternatives for the proposed anesthesia with the patient or authorized representative who has indicated his/her understanding and acceptance.   Dental advisory given  Plan Discussed with: CRNA and Anesthesiologist  Anesthesia Plan Comments:         Anesthesia Quick Evaluation

## 2016-08-19 NOTE — Brief Op Note (Signed)
08/19/2016  12:17 PM  PATIENT:  Clinton Pearson  66 y.o. male  PRE-OPERATIVE DIAGNOSIS:  TAA AI  POST-OPERATIVE DIAGNOSIS:  TAA AI  PROCEDURE:  Procedure(s):  BENTALL PROCEDURE WITH CIRC ARREST (N/A) -Aortic Valve Replacement with a 25 mm Edwards Magna Ease Pericardial Tissue Valve -28 Hemashield Valsalva Graft Re-implantation Right and Left Coronary Artery  REPLACEMENT ASCENDING AORTA (N/A) -28 Hemashield Valsalva Graft  CLIPPING OF ATRIAL APPENDAGE (N/A) -Size 40 Atri-Clip Pro  TRANSESOPHAGEAL ECHOCARDIOGRAM (TEE) (N/A)  SURGEON:  Surgeon(s) and Role:    * Gaye Pollack, MD - Primary  PHYSICIAN ASSISTANT: Yarnell Arvidson PA-C  ANESTHESIA:   general  EBL:  Total I/O In: 2200 [I.V.:2200] Out: 1175 [Urine:1175]  BLOOD ADMINISTERED: CELLSAVER  DRAINS: Mediastinal Chest Drains   LOCAL MEDICATIONS USED:  NONE  SPECIMEN:  Source of Specimen:  Ascending Aortic Aneurysm  DISPOSITION OF SPECIMEN:  PATHOLOGY  COUNTS:  YES  TOURNIQUET:  * No tourniquets in log *  DICTATION: .Dragon Dictation  PLAN OF CARE: Admit to inpatient   PATIENT DISPOSITION:  ICU - intubated and hemodynamically stable.   Delay start of Pharmacological VTE agent (>24hrs) due to surgical blood loss or risk of bleeding: yes

## 2016-08-19 NOTE — OR Nursing (Signed)
13:17- 20 minute call to SICU charge nurse

## 2016-08-19 NOTE — Anesthesia Procedure Notes (Signed)
Procedure Name: Intubation Date/Time: 08/19/2016 7:51 AM Performed by: Tressia Miners LEFFEW Pre-anesthesia Checklist: Patient identified, Patient being monitored, Timeout performed, Emergency Drugs available and Suction available Patient Re-evaluated:Patient Re-evaluated prior to inductionOxygen Delivery Method: Circle System Utilized Preoxygenation: Pre-oxygenation with 100% oxygen Intubation Type: IV induction Ventilation: Mask ventilation without difficulty Laryngoscope Size: Glidescope and 4 Grade View: Grade I Tube type: Oral Tube size: 8.0 mm Number of attempts: 1 Airway Equipment and Method: Rigid stylet Placement Confirmation: ETT inserted through vocal cords under direct vision,  positive ETCO2 and breath sounds checked- equal and bilateral Secured at: 22 cm Tube secured with: Tape Dental Injury: Teeth and Oropharynx as per pre-operative assessment

## 2016-08-19 NOTE — Transfer of Care (Signed)
Immediate Anesthesia Transfer of Care Note  Patient: JEREMEE DOMEN  Procedure(s) Performed: Procedure(s): BENTALL PROCEDURE WITH CIRC ARREST (N/A) REPLACEMENT ASCENDING AORTA (N/A) CLIPPING OF ATRIAL APPENDAGE - usingAtriClip Pro 40 (N/A) TRANSESOPHAGEAL ECHOCARDIOGRAM (TEE) (N/A)  Patient Location: SICU  Anesthesia Type:General  Level of Consciousness: Patient remains intubated per anesthesia plan  Airway & Oxygen Therapy: Patient remains intubated per anesthesia plan and Patient placed on Ventilator (see vital sign flow sheet for setting)  Post-op Assessment: Report given to RN and Post -op Vital signs reviewed and stable  Post vital signs: Reviewed and stable  Last Vitals:  Vitals:   08/19/16 0709 08/19/16 0710  BP:    Pulse: 80 77  Resp: (!) 0 (!) 0  Temp:      Last Pain:  Vitals:   08/19/16 0608  TempSrc: Oral         Complications: No apparent anesthesia complications

## 2016-08-19 NOTE — Progress Notes (Signed)
  Echocardiogram 2D Echocardiogram has been performed.  Shamya Macfadden L Androw 08/19/2016, 9:05 AM

## 2016-08-19 NOTE — Progress Notes (Signed)
CT Surgery  Starting to wake up on vent Hemodynamics stable nsr  chest tube drainage minimal

## 2016-08-19 NOTE — Procedures (Signed)
Extubation Procedure Note  Patient Details:   Name: KHADIN BRESETTE DOB: Jun 06, 1951 MRN: [id number]   Airway Documentation:     Evaluation  O2 sats: stable throughout Complications: No apparent complications Patient did tolerate procedure well. Bilateral Breath Sounds: Clear   Yes   Pt. Was extubated to a 2L [place] without any complications, dyspnea or stridor noted. Pt. Was instructed on IS x 6, highest goal achieved was 1734mL. NIF: -20, VC: .4L.  Claretta Fraise 08/19/2016, 6:40 PM

## 2016-08-19 NOTE — Progress Notes (Signed)
  Echocardiogram Echocardiogram Transesophageal has been performed.  Chalon Zobrist L Androw 08/19/2016, 11:52 AM

## 2016-08-19 NOTE — Anesthesia Procedure Notes (Addendum)
Central Venous Catheter Insertion Performed by: Lillia Abed, anesthesiologist Start/End1/06/2017 6:40 AM, 08/19/2016 6:50 AM Preanesthetic checklist: patient identified, IV checked, site marked, risks and benefits discussed, surgical consent, monitors and equipment checked, pre-op evaluation, timeout performed and anesthesia consent Position: Trendelenburg Lidocaine 1% used for infiltration and patient sedated Hand hygiene performed , maximum sterile barriers used  and Seldinger technique used Central line was placed.Sheath introducer Swan type:thermodilution Ultrasound Notes:anatomy identified, needle tip was noted to be adjacent to the nerve/plexus identified, no ultrasound evidence of intravascular and/or intraneural injection and image(s) printed for medical record Attempts: 1 Following insertion, line sutured and dressing applied. Post procedure assessment: blood return through all ports, free fluid flow and no air  Patient tolerated the procedure well with no immediate complications.

## 2016-08-19 NOTE — OR Nursing (Signed)
12:45 - 45 minute call to SICU charge nurse

## 2016-08-20 ENCOUNTER — Inpatient Hospital Stay (HOSPITAL_COMMUNITY): Payer: Medicare Other

## 2016-08-20 ENCOUNTER — Encounter (HOSPITAL_COMMUNITY): Payer: Self-pay | Admitting: Surgery

## 2016-08-20 LAB — POCT I-STAT, CHEM 8
BUN: 16 mg/dL (ref 6–20)
CALCIUM ION: 1.26 mmol/L (ref 1.15–1.40)
CHLORIDE: 104 mmol/L (ref 101–111)
Creatinine, Ser: 0.7 mg/dL (ref 0.61–1.24)
Glucose, Bld: 162 mg/dL — ABNORMAL HIGH (ref 65–99)
HCT: 33 % — ABNORMAL LOW (ref 39.0–52.0)
HEMOGLOBIN: 11.2 g/dL — AB (ref 13.0–17.0)
Potassium: 4.3 mmol/L (ref 3.5–5.1)
SODIUM: 138 mmol/L (ref 135–145)
TCO2: 20 mmol/L (ref 0–100)

## 2016-08-20 LAB — GLUCOSE, CAPILLARY
GLUCOSE-CAPILLARY: 91 mg/dL (ref 65–99)
Glucose-Capillary: 123 mg/dL — ABNORMAL HIGH (ref 65–99)

## 2016-08-20 LAB — CBC
HCT: 32.1 % — ABNORMAL LOW (ref 39.0–52.0)
HCT: 33.3 % — ABNORMAL LOW (ref 39.0–52.0)
Hemoglobin: 10.8 g/dL — ABNORMAL LOW (ref 13.0–17.0)
Hemoglobin: 11.2 g/dL — ABNORMAL LOW (ref 13.0–17.0)
MCH: 30.3 pg (ref 26.0–34.0)
MCH: 30.3 pg (ref 26.0–34.0)
MCHC: 33.6 g/dL (ref 30.0–36.0)
MCHC: 33.6 g/dL (ref 30.0–36.0)
MCV: 90.2 fL (ref 78.0–100.0)
MCV: 90 fL (ref 78.0–100.0)
PLATELETS: 114 10*3/uL — AB (ref 150–400)
PLATELETS: 97 10*3/uL — AB (ref 150–400)
RBC: 3.56 MIL/uL — ABNORMAL LOW (ref 4.22–5.81)
RBC: 3.7 MIL/uL — AB (ref 4.22–5.81)
RDW: 14.8 % (ref 11.5–15.5)
RDW: 15 % (ref 11.5–15.5)
WBC: 15.3 10*3/uL — ABNORMAL HIGH (ref 4.0–10.5)
WBC: 16.5 10*3/uL — ABNORMAL HIGH (ref 4.0–10.5)

## 2016-08-20 LAB — CREATININE, SERUM
Creatinine, Ser: 0.89 mg/dL (ref 0.61–1.24)
GFR calc Af Amer: >60 mL/min (ref >60)
GFR calc non Af Amer: >60 mL/min (ref >60)

## 2016-08-20 LAB — BASIC METABOLIC PANEL
Anion gap: 8 (ref 5–15)
BUN: 12 mg/dL (ref 6–20)
CALCIUM: 8.2 mg/dL — AB (ref 8.9–10.3)
CO2: 19 mmol/L — ABNORMAL LOW (ref 22–32)
CREATININE: 0.87 mg/dL (ref 0.61–1.24)
Chloride: 110 mmol/L (ref 101–111)
GFR calc Af Amer: >60 mL/min (ref >60)
GFR calc non Af Amer: >60 mL/min (ref >60)
GLUCOSE: 125 mg/dL — AB (ref 65–99)
Potassium: 4.4 mmol/L (ref 3.5–5.1)
SODIUM: 137 mmol/L (ref 135–145)

## 2016-08-20 LAB — MAGNESIUM
Magnesium: 2.2 mg/dL (ref 1.7–2.4)
Magnesium: 2.2 mg/dL (ref 1.7–2.4)

## 2016-08-20 MED ORDER — CYCLOBENZAPRINE HCL 10 MG PO TABS
10.0000 mg | ORAL_TABLET | Freq: Every day | ORAL | Status: DC
  Administered 2016-08-20 – 2016-08-22 (×3): 10 mg via ORAL
  Filled 2016-08-20 (×3): qty 1

## 2016-08-20 MED ORDER — ENOXAPARIN SODIUM 40 MG/0.4ML ~~LOC~~ SOLN
40.0000 mg | Freq: Every day | SUBCUTANEOUS | Status: DC
  Administered 2016-08-20 – 2016-08-22 (×3): 40 mg via SUBCUTANEOUS
  Filled 2016-08-20 (×3): qty 0.4

## 2016-08-20 MED FILL — Electrolyte-R (PH 7.4) Solution: INTRAVENOUS | Qty: 4000 | Status: AC

## 2016-08-20 MED FILL — Lidocaine HCl IV Inj 20 MG/ML: INTRAVENOUS | Qty: 5 | Status: AC

## 2016-08-20 MED FILL — Sodium Bicarbonate IV Soln 8.4%: INTRAVENOUS | Qty: 50 | Status: AC

## 2016-08-20 MED FILL — Heparin Sodium (Porcine) Inj 1000 Unit/ML: INTRAMUSCULAR | Qty: 30 | Status: AC

## 2016-08-20 MED FILL — Mannitol IV Soln 20%: INTRAVENOUS | Qty: 500 | Status: AC

## 2016-08-20 MED FILL — Sodium Chloride IV Soln 0.9%: INTRAVENOUS | Qty: 2000 | Status: AC

## 2016-08-20 NOTE — Progress Notes (Signed)
1 Day Post-Op Procedure(s) (LRB): BENTALL PROCEDURE WITH CIRC ARREST (N/A) REPLACEMENT ASCENDING AORTA (N/A) CLIPPING OF ATRIAL APPENDAGE - usingAtriClip Pro 40 (N/A) TRANSESOPHAGEAL ECHOCARDIOGRAM (TEE) (N/A) Subjective:  No complaints  Objective: Vital signs in last 24 hours: Temp:  [96.8 F (36 C)-98.6 F (37 C)] 98.4 F (36.9 C) (01/12 0700) Pulse Rate:  [72-86] 76 (01/12 0600) Cardiac Rhythm: Normal sinus rhythm (01/12 0700) Resp:  [0-23] 17 (01/12 0700) BP: (83-133)/(52-87) 83/57 (01/12 0700) SpO2:  [99 %-100 %] 100 % (01/12 0700) Arterial Line BP: (104-121)/(67-78) 118/73 (01/11 1615) FiO2 (%):  [40 %-50 %] 40 % (01/11 1745) Weight:  [92.2 kg (203 lb 4.2 oz)] 92.2 kg (203 lb 4.2 oz) (01/12 0500)  Hemodynamic parameters for last 24 hours: PAP: (15-28)/(5-13) 19/6 CO:  [4 L/min-7.2 L/min] 7.2 L/min CI:  [1.8 L/min/m2-3.2 L/min/m2] 3.2 L/min/m2  Intake/Output from previous day: 01/11 0701 - 01/12 0700 In: 6444.8 [P.O.:180; I.V.:4149.9; Blood:440; NG/GT:30; IV Piggyback:1644.9] Out: [id number] [Urine:3780; Blood:1000; Chest Tube:750] Intake/Output this shift: No intake/output data recorded.  General appearance: alert and cooperative Neurologic: intact Heart: regular rate and rhythm, S1, S2 normal, no murmur, click, rub or gallop Lungs: clear to auscultation bilaterally Extremities: extremities normal, atraumatic, no cyanosis or edema Wound: dressing dry  Lab Results:  Recent Labs  08/19/16 2000 08/20/16 0425  WBC 10.3 15.3*  HGB 11.1* 10.8*  HCT 32.6* 32.1*  PLT 112* 114*   BMET:  Recent Labs  08/17/16 1245  08/19/16 1955 08/19/16 2000 08/20/16 0425  NA 139  < > 142  --  137  K 4.0  < > 3.9  --  4.4  CL 113*  < > 110  --  110  CO2 17*  --   --   --  19*  GLUCOSE 95  < > 99  --  125*  BUN 13  < > 13  --  12  CREATININE 0.84  < > 0.80 0.85 0.87  CALCIUM 9.4  --   --   --  8.2*  < > = values in this interval not displayed.  PT/INR:  Recent Labs  08/19/16 1444  LABPROT 17.9*  INR 1.46   ABG    Component Value Date/Time   PHART 7.390 08/19/2016 1811   HCO3 19.6 (L) 08/19/2016 1811   TCO2 21 08/19/2016 1955   ACIDBASEDEF 5.0 (H) 08/19/2016 1811   O2SAT 100.0 08/19/2016 1811   CBG (last 3)   Recent Labs  08/19/16 2210 08/19/16 2330 08/20/16 0347  GLUCAP 76 79 91   CXR: ok  ECG: sinus, 1st degree AV block, non-specific changes.  Assessment/Plan: S/P Procedure(s) (LRB): BENTALL PROCEDURE WITH CIRC ARREST (N/A) REPLACEMENT ASCENDING AORTA (N/A) CLIPPING OF ATRIAL APPENDAGE - usingAtriClip Pro 40 (N/A) TRANSESOPHAGEAL ECHOCARDIOGRAM (TEE) (N/A)  He is hemodynamically stable in sinus rhythm on low dose neo. Hold beta blocker until off neo. 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 Continue SCD's and start Lovenox for DVT prophylaxis. Will probably resume Xarelto at discharge.   LOS: 1 day    Gaye Pollack 08/20/2016

## 2016-08-20 NOTE — Progress Notes (Signed)
Patient ID: Clinton Pearson, male   DOB: [birth year], 66 y.o.   MRN: [id number]   SICU Evening Rounds:   Hemodynamically stable  Neo is off  Ambulated 300 ft. Up in chair, pain under control with tylenol  Urine output good    CBC    Component Value Date/Time   WBC 16.5 (H) 08/20/2016 1610   RBC 3.70 (L) 08/20/2016 1610   HGB 11.2 (L) 08/20/2016 1614   HCT 33.0 (L) 08/20/2016 1614   PLT 97 (L) 08/20/2016 1610   MCV 90.0 08/20/2016 1610   MCH 30.3 08/20/2016 1610   MCHC 33.6 08/20/2016 1610   RDW 15.0 08/20/2016 1610     BMET    Component Value Date/Time   NA 138 08/20/2016 1614   K 4.3 08/20/2016 1614   CL 104 08/20/2016 1614   CO2 19 (L) 08/20/2016 0425   GLUCOSE 162 (H) 08/20/2016 1614   BUN 16 08/20/2016 1614   CREATININE 0.70 08/20/2016 1614   CREATININE 0.91 07/28/2016 1236   CALCIUM 8.2 (L) 08/20/2016 0425   GFRNONAA >60 08/20/2016 1610   GFRAA >60 08/20/2016 1610     A/P:  Stable postop course. Continue current plans

## 2016-08-20 NOTE — Care Management Note (Signed)
Case Management Note  Patient Details  Name: Clinton Pearson MRN: [id number] Date of Birth: Jul 15, 1951  Subjective/Objective:       S/p Bentall procedure and clip             Action/Plan:  Pt is independent from home with wife.  CM will continue to follow for discharge needs   Expected Discharge Date:                  Expected Discharge Plan:     In-House Referral:     Discharge planning Services     Post Acute Care Choice:    Choice offered to:     DME Arranged:    DME Agency:     HH Arranged:    HH Agency:     Status of Service:     If discussed at [employer] of Stay Meetings, dates discussed:    Additional Comments:  Maryclare Labrador, RN 08/20/2016, 10:09 AM

## 2016-08-21 ENCOUNTER — Inpatient Hospital Stay (HOSPITAL_COMMUNITY): Payer: Medicare Other

## 2016-08-21 LAB — CBC
HCT: 30.2 % — ABNORMAL LOW (ref 39.0–52.0)
Hemoglobin: 10.1 g/dL — ABNORMAL LOW (ref 13.0–17.0)
MCH: 30.5 pg (ref 26.0–34.0)
MCHC: 33.4 g/dL (ref 30.0–36.0)
MCV: 91.2 fL (ref 78.0–100.0)
Platelets: 88 10*3/uL — ABNORMAL LOW (ref 150–400)
RBC: 3.31 MIL/uL — AB (ref 4.22–5.81)
RDW: 15.3 % (ref 11.5–15.5)
WBC: 14.4 10*3/uL — AB (ref 4.0–10.5)

## 2016-08-21 LAB — BASIC METABOLIC PANEL
Anion gap: 9 (ref 5–15)
BUN: 15 mg/dL (ref 6–20)
CALCIUM: 8.5 mg/dL — AB (ref 8.9–10.3)
CO2: 23 mmol/L (ref 22–32)
CREATININE: 0.84 mg/dL (ref 0.61–1.24)
Chloride: 104 mmol/L (ref 101–111)
Glucose, Bld: 136 mg/dL — ABNORMAL HIGH (ref 65–99)
Potassium: 4 mmol/L (ref 3.5–5.1)
SODIUM: 136 mmol/L (ref 135–145)

## 2016-08-21 MED ORDER — POTASSIUM CHLORIDE CRYS ER 20 MEQ PO TBCR
20.0000 meq | EXTENDED_RELEASE_TABLET | Freq: Two times a day (BID) | ORAL | Status: DC
  Administered 2016-08-21 – 2016-08-23 (×5): 20 meq via ORAL
  Filled 2016-08-21 (×4): qty 1

## 2016-08-21 MED ORDER — BISACODYL 5 MG PO TBEC: 10.0000 mg | DELAYED_RELEASE_TABLET | Freq: Every day | ORAL | Status: DC | PRN

## 2016-08-21 MED ORDER — ASPIRIN EC 325 MG PO TBEC: 325.0000 mg | DELAYED_RELEASE_TABLET | Freq: Every day | ORAL | Status: DC

## 2016-08-21 MED ORDER — ACETAMINOPHEN 325 MG PO TABS
650.0000 mg | ORAL_TABLET | Freq: Four times a day (QID) | ORAL | Status: DC | PRN
  Administered 2016-08-21: 650 mg via ORAL
  Filled 2016-08-21: qty 2

## 2016-08-21 MED ORDER — BISACODYL 10 MG RE SUPP: 10.0000 mg | Freq: Every day | RECTAL | Status: DC | PRN

## 2016-08-21 MED ORDER — OXYCODONE HCL 5 MG PO TABS: 5.0000 mg | ORAL_TABLET | ORAL | Status: DC | PRN

## 2016-08-21 MED ORDER — MOVING RIGHT ALONG BOOK
Freq: Once | Status: DC
  Filled 2016-08-21: qty 1

## 2016-08-21 MED ORDER — PANTOPRAZOLE SODIUM 40 MG PO TBEC: 40.0000 mg | DELAYED_RELEASE_TABLET | Freq: Every day | ORAL | Status: DC

## 2016-08-21 MED ORDER — FUROSEMIDE 40 MG PO TABS
40.0000 mg | ORAL_TABLET | Freq: Every day | ORAL | Status: AC
  Administered 2016-08-21 – 2016-08-23 (×3): 40 mg via ORAL
  Filled 2016-08-21 (×3): qty 1

## 2016-08-21 MED ORDER — TRAMADOL HCL 50 MG PO TABS: 50.0000 mg | ORAL_TABLET | ORAL | Status: DC | PRN

## 2016-08-21 MED ORDER — SODIUM CHLORIDE 0.9% FLUSH
3.0000 mL | Freq: Two times a day (BID) | INTRAVENOUS | Status: DC
  Administered 2016-08-22 (×2): 3 mL via INTRAVENOUS

## 2016-08-21 MED ORDER — SODIUM CHLORIDE 0.9% FLUSH: 3.0000 mL | INTRAVENOUS | Status: DC | PRN

## 2016-08-21 MED ORDER — ONDANSETRON HCL 4 MG/2ML IJ SOLN: 4.0000 mg | Freq: Four times a day (QID) | INTRAMUSCULAR | Status: DC | PRN

## 2016-08-21 MED ORDER — METOPROLOL TARTRATE 12.5 MG HALF TABLET
12.5000 mg | ORAL_TABLET | Freq: Two times a day (BID) | ORAL | Status: DC
  Administered 2016-08-21 – 2016-08-23 (×3): 12.5 mg via ORAL
  Filled 2016-08-21 (×2): qty 1

## 2016-08-21 MED ORDER — SODIUM CHLORIDE 0.9 % IV SOLN: 250.0000 mL | INTRAVENOUS | Status: DC | PRN

## 2016-08-21 MED ORDER — ONDANSETRON HCL 4 MG PO TABS: 4.0000 mg | ORAL_TABLET | Freq: Four times a day (QID) | ORAL | Status: DC | PRN

## 2016-08-21 MED ORDER — DOCUSATE SODIUM 100 MG PO CAPS: 200.0000 mg | ORAL_CAPSULE | Freq: Every day | ORAL | Status: DC

## 2016-08-21 NOTE — Progress Notes (Signed)
Patient arrived to the floor with nurse at side. Denies pain/discomfort.Will continue to monitor.

## 2016-08-22 DIAGNOSIS — Z95828 Presence of other vascular implants and grafts: Secondary | ICD-10-CM

## 2016-08-22 NOTE — Progress Notes (Addendum)
[address]       [place] [address]             [phone]      3 Days Post-Op Procedure(s) (LRB): BENTALL PROCEDURE WITH CIRC ARREST (N/A) REPLACEMENT ASCENDING AORTA (N/A) CLIPPING OF ATRIAL APPENDAGE - usingAtriClip Pro 40 (N/A) TRANSESOPHAGEAL ECHOCARDIOGRAM (TEE) (N/A)   Subjective:  Some pain today, but states didn't get pain medication until later.  He remains concerned about needing a blood thinner long term.  + ambulation  + BM  Objective: Vital signs in last 24 hours: Temp:  [99 F (37.2 C)-100.4 F (38 C)] 100.4 F (38 C) (01/14 1100) Pulse Rate:  [92-107] 94 (01/14 1100) Cardiac Rhythm: Atrial fibrillation (01/14 0917) Resp:  [13-28] 20 (01/14 0553) BP: (88-116)/(63-75) 116/70 (01/14 1100) SpO2:  [92 %-100 %] 96 % (01/14 0553)  Intake/Output from previous day: 01/13 0701 - 01/14 0700 In: 160 [P.O.:120; I.V.:40] Out: 1300 [Urine:1300] Intake/Output this shift: Total I/O In: 220 [P.O.:220] Out: -   General appearance: alert, cooperative and no distress Heart: irregularly irregular rhythm Lungs: clear to auscultation bilaterally Abdomen: soft, non-tender; bowel sounds normal; no masses,  no organomegaly Extremities: edema trace Wound: clean and dry  Lab Results:  Recent Labs  08/20/16 1610 08/20/16 1614 08/21/16 0500  WBC 16.5*  --  14.4*  HGB 11.2* 11.2* 10.1*  HCT 33.3* 33.0* 30.2*  PLT 97*  --  88*   BMET:  Recent Labs  08/20/16 0425  08/20/16 1614 08/21/16 0500  NA 137  --  138 136  K 4.4  --  4.3 4.0  CL 110  --  104 104  CO2 19*  --   --  23  GLUCOSE 125*  --  162* 136*  BUN 12  --  16 15  CREATININE 0.87  < > 0.70 0.84  CALCIUM 8.2*  --   --  8.5*  < > = values in this interval not displayed.  PT/INR:  Recent Labs  08/19/16 1444  LABPROT 17.9*  INR 1.46   ABG    Component Value Date/Time   PHART 7.390 08/19/2016 1811   HCO3 19.6 (L) 08/19/2016 1811   TCO2 20 08/20/2016 1614   ACIDBASEDEF  5.0 (H) 08/19/2016 1811   O2SAT 100.0 08/19/2016 1811   CBG (last 3)   Recent Labs  08/19/16 2330 08/20/16 0347 08/20/16 0845  GLUCAP 79 91 123*    Assessment/Plan: S/P Procedure(s) (LRB): BENTALL PROCEDURE WITH CIRC ARREST (N/A) REPLACEMENT ASCENDING AORTA (N/A) CLIPPING OF ATRIAL APPENDAGE - usingAtriClip Pro 40 (N/A) TRANSESOPHAGEAL ECHOCARDIOGRAM (TEE) (N/A)  1. CV- A. Fib, rate controlled- had episodes preoperatively- continue Lopressor, will plan to start home Xarelto at discharge 2. Pulm- no acute issues, continue IS... CXR with mild pulmonary congestion 3. Renal- creatinine WNL, weight is stable, give Lasix today 4. Thrombtocytopenia- watch platelets 5. Expected blood loss anemia- Hgb is stable at 10.1 6. Dispo- patient with rate controlled A. Fib, will d/c EPW today, restart Xarelto at discharge... However placed CM consult to ensure insurance will continue to cover post valve replacement.  If they will not cover will likely need started on Coumadin   LOS: 3 days    Ellwood Handler 08/22/2016   Chart reviewed, patient examined, agree with above. He looks good. 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 Will plan to resume Xarelto and ASA 81 mg at discharge and then his cardiologist can decide about long term anticoagulation.

## 2016-08-22 NOTE — Anesthesia Postprocedure Evaluation (Signed)
Anesthesia Post Note  Patient: Clinton Pearson  Procedure(s) Performed: Procedure(s) (LRB): BENTALL PROCEDURE WITH CIRC ARREST (N/A) REPLACEMENT ASCENDING AORTA (N/A) CLIPPING OF ATRIAL APPENDAGE - usingAtriClip Pro 40 (N/A) TRANSESOPHAGEAL ECHOCARDIOGRAM (TEE) (N/A)  Patient location during evaluation: SICU Anesthesia Type: General Level of consciousness: patient remains intubated per anesthesia plan Pain management: pain level controlled Vital Signs Assessment: post-procedure vital signs reviewed and stable Respiratory status: patient remains intubated per anesthesia plan Cardiovascular status: stable Anesthetic complications: no       Last Vitals:  Vitals:   08/22/16 1100 08/22/16 1311  BP: 116/70 105/60  Pulse: 94 (!) 110  Resp:  16  Temp: (!) 38 C 37.7 C    Last Pain:  Vitals:   08/22/16 1311  TempSrc: Oral  PainSc:                  Amiyah Shryock

## 2016-08-22 NOTE — Progress Notes (Signed)
Removed epicardial wires per order. 4 intact.  Pt tolerated procedure well.  Pt instructed to remain on bedrest for one hour.  Frequent vitals will be taken and documented. Pt resting with call bell within reach. Payton Emerald, RN

## 2016-08-22 NOTE — Discharge Instructions (Signed)
1. Please contact primary Cardiologist to schedule hospital discharge appointment    Aortic Valve Replacement, Care After Refer to this sheet in the next few weeks. These instructions provide you with information about caring for yourself after your procedure. Your health care provider may also give you more specific instructions. Your treatment has been planned according to current medical practices, but problems sometimes occur. Call your health care provider if you have any problems or questions after your procedure. What can I expect after the procedure? After the procedure, it is common to have:  Pain around your incision area.  A small amount of blood or clear fluid coming from your incision. Follow these instructions at home: Eating and drinking  Follow instructions from your health care provider about eating or drinking restrictions.  Limit alcohol intake to no more than 1 drink per day for nonpregnant women and 2 drinks per day for men. One drink equals 12 oz of beer, 5 oz of wine, or 1 oz of hard liquor.  Limit how much caffeine you drink. Caffeine can affect your heart's rate and rhythm.  Drink enough fluid to keep your urine clear or pale yellow.  Eat a heart-healthy diet. This should include plenty of fresh fruits and vegetables. If you eat meat, it should be lean cuts. Avoid foods that are:  High in salt, saturated fat, or sugar.  Canned or highly processed.  Clinton Pearson. Activity  Return to your normal activities as told by your health care provider. Ask your health care provider what activities are safe for you.  Exercise regularly once you have recovered, as told by your health care provider.  Avoid sitting for more than 2 hours at a time without moving. Get up and move around at least once every 1-2 hours. This helps to prevent blood clots in the legs.  Do not lift anything that is heavier than 10 lb (4.5 kg) until your health care provider approves.  Avoid  pushing or pulling things with your arms until your health care provider approves. This includes pulling on handrails to help you climb stairs. Incision care  Follow instructions from your health care provider about how to take care of your incision. Make sure you:  Wash your hands with soap and water before you change your bandage (dressing). If soap and water are not available, use hand sanitizer.  Change your dressing as told by your health care provider.  Leave stitches (sutures), skin glue, or adhesive strips in place. These skin closures may need to stay in place for 2 weeks or longer. If adhesive strip edges start to loosen and curl up, you may trim the loose edges. Do not remove adhesive strips completely unless your health care provider tells you to do that.  Check your incision area every day for signs of infection. Check for:  More redness, swelling, or pain.  More fluid or blood.  Warmth.  Pus or a bad smell. Medicines  Take over-the-counter and prescription medicines only as told by your health care provider.  If you were prescribed an antibiotic medicine, take it as told by your health care provider. Do not stop taking the antibiotic even if you start to feel better. Travel  Avoid airplane travel for as long as told by your health care provider.  When you travel, bring a list of your medicines and a record of your medical history with you. Carry your medicines with you. Driving  Ask your health care provider when it is safe  for you to drive. Do not drive until your health care provider approves.  Do not drive or operate heavy machinery while taking prescription pain medicine. Lifestyle  Do not use any tobacco products, such as cigarettes, chewing tobacco, or e-cigarettes. If you need help quitting, ask your health care provider.  Resume sexual activity as told by your health care provider. Do not use medicines for erectile dysfunction unless your health care  provider approves, if this applies.  Work with your health care provider to keep your blood pressure and cholesterol under control, and to manage any other heart conditions that you have.  Maintain a healthy weight. General instructions  Do not take baths, swim, or use a hot tub until your health care provider approves.  Do not strain to have a bowel movement.  Avoid crossing your legs while sitting down.  Check your temperature every day for a fever. A fever may be a sign of infection.  If you are a woman and you plan to become pregnant, talk with your health care provider before you become pregnant.  Wear compression stockings if your health care provider instructs you to do this. These stockings help to prevent blood clots and reduce swelling in your legs.  Tell all health care providers who care for you that you have an artificial (prosthetic) aortic valve. If you have or have had heart disease or endocarditis, tell all health care providers about these conditions as well.  Keep all follow-up visits as told by your health care provider. This is important. Contact a health care provider if:  You develop a skin rash.  You experience sudden, unexplained changes in your weight.  You have more redness, swelling, or pain around your incision.  You have more fluid or blood coming from your incision.  Your incision feels warm to the touch.  You have pus or a bad smell coming from your incision.  You have a fever. Get help right away if:  You develop chest pain that is different from the pain coming from your incision.  You develop shortness of breath or difficulty breathing.  You start to feel light-headed. These symptoms may represent a serious problem that is an emergency. Do not wait to see if the symptoms will go away. Get medical help right away. Call your local emergency services (911 in the [place] Do not drive yourself to the hospital.  This information is not intended  to replace advice given to you by your health care provider. Make sure you discuss any questions you have with your health care provider. Document Released: 02/11/2005 Document Revised: 01/01/2016 Document Reviewed: 06/29/2015 Elsevier Interactive Patient Education  2017 [employer].

## 2016-08-22 NOTE — Discharge Summary (Signed)
Physician Discharge Summary  Patient ID: YOMAR MANNERS MRN: [id number] DOB/AGE: Sep 15, 1950 66 y.o.  Admit date: 08/19/2016 Discharge date: 08/23/2016  Admission Diagnoses:  Patient Active Problem List   Diagnosis Date Noted  . Thoracic aortic aneurysm without rupture ([place])   . Arthritis   . Aortic regurgitation   . Personal history of PE (pulmonary embolism)   . Thoracic ascending aortic aneurysm ([place]) 03/10/2015  . Dilated aortic root ([place]) 03/10/2015   Discharge Diagnoses:   Patient Active Problem List   Diagnosis Date Noted  . Hx of ascending aorta replacement 08/22/2016  . S/P AVR (aortic valve replacement) 08/19/2016  . Thoracic aortic aneurysm without rupture ([place])   . Arthritis   . Aortic regurgitation   . Personal history of PE (pulmonary embolism)   . Thoracic ascending aortic aneurysm ([place]) 03/10/2015  . Dilated aortic root ([place]) 03/10/2015   Discharged Condition: good  History of Present Illness:  Mr. Caraway is a 66 yo white male who presented with pulmonary embolism in 2016 which was felt to be related to DVT from sleeping in a recliner.  He was started on Xarelto.  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  CT scan was performed in August of 2016 which showed an incidental finding of an ascending aortic aneurysm measuring 4.4 cm.  Echocardiogram performed the same year showed a dilated aortic root at the sinus of valsalva measuring 4.9 cm.  Repeat Echocardiogram in March of 2017 showed no change in the aorta, but showed moderate Aortic Insufficiency and a preserved EF.  He was initially seen by Dr. Cyndia Bent in June 2017 at which time he recommended repeat CTA of the chest to monitor for further enlargement of his aorta.  He again presented for follow up with Dr. Cyndia Bent in December of 2017.  At that visit the patient showed a weight loss from simply making better meal choices.   His biggest complaints was for arthritic pain for which he was unable to take NSAIDs for due to taking a blood thinner.  He had an event monitor in October which did show episodes of Atrial Fibrillation/flutter.  Repeat CTA of the chest showed worsening enlargement of the patient's Aorta measuring 5.4 cm in diameter.  It was felt surgical intervention would be indicated.  The risks and benefits of the procedure were explained to the patient and he was agreeable to proceed.  Prior to surgery he underwent cardiac catheterization which showed no CAD.      Hospital Course:   Mr. Arola presented to [facility] on 08/19/2016.  He was taken to the operating room and underwent Replacement of the ascending aorta under hypothermic circulatory arrest, biologic bentall procedure with a 25 mm Edwards Magna Ease Pericardial Tissue Valve, and placement of a 35 mm Atricure left atrial clip.  He tolerated the procedure without difficulty and was taken to the SICU in stable condition.  He was extubated the evening of surgery.  During his stay in the SICU the patient was weaned off Neo synephrine as tolerated.  His chest tubes and arterial lines were removed without difficulty.  He was started on Lovenox for DVT prophylaxis with history of factor 2 mutation.  He was in NSR with 1st degree heart block and felt medically stable for transfer to the telemetry unit for further convalescence.  The patient continues to make good progress.  He developed rate controlled atrial  fibrillation.  He was treated with a beta blocker.  His blood pressure is labile and therefore no antihypertesnive agents were started.  He has thrombocytopenia.  His temporary pacing wires were removed without difficulty.  He was on Xarelto prior to admission.  This will be resumed at discharge.  The patient is concerned about requiring this long term.  I instructed the patient to speak with his Cardiologist in regards to length of treatment.  He is  ambulating independently.  His pain is well controlled.  He is medically stable for discharge home today.     Significant Diagnostic Studies:   radiology:   CT scan:  Aortic root is aneurysmal at 5.4 cm. 4.1 cm ascending thoracic aortic aneurysm is noted. Recommend annual imaging followup by CTA or MRA. This recommendation follows 2010 ACCF/AHA/AATS/ACR/ASA/SCA/SCAI/SIR/STS/SVM Guidelines for the Diagnosis and Management of Patients with Thoracic Aortic Disease    Echocardiogram:    Left ventricle: LV systolic function is low normal with an EF of 50-55%. There are no obvious wall motion abnormalities.  Septum: No Patent Foramen Ovale present.  Left atrium: Patent foramen ovale not present.  Aortic valve: The valve is trileaflet. Moderate to severe regurgitation.  Aorta: Aneurysm present in the ascending aorta.  Treatments: surgery:   1. Median Sternotomy 2. Extracorporeal circulation 3.   Replacement of the ascending aorta (hemi-arch) using a 28 mm Hemashield graft under deep hypothermic circulatory arrest 4.   Biological Bentall Procedure using a 25 mm Edwards Magna-Ease pericardial valve and a 28 mm Gelweave Valsalva graft. 5.  Placement of 35 mm Atricure left atrial clip.  Disposition: 01-Home or Self Care   Discharge Medications:  The patient has been discharged on:   1.Beta Blocker:  Yes [ x  ]                              No   [   ]                              If No, reason:  2.Ace Inhibitor/ARB: Yes [   ]                                     No  [  x  ]                                     If No, reason: labile BP  3.Statin:   Yes [   ]                  No  [ x  ]                  If No, reason: No CAD  4.Shela CommonsLauro Regulus   ]                  No   [   ]                  If No, reason:      Allergies as of 08/23/2016      Reactions   Adhesive [tape] Other (See Comments)   Skin irritation, and tears at skin -  please use paper tape      Medication  List    STOP taking these medications   baclofen 10 MG tablet Commonly known as:  LIORESAL   traMADol 50 MG tablet Commonly known as:  ULTRAM     TAKE these medications   acetaminophen 500 MG tablet Commonly known as:  TYLENOL Take 500 mg by mouth 3 (three) times daily.   cetirizine 10 MG tablet Commonly known as:  ZYRTEC Take 10 mg by mouth daily.   cyclobenzaprine 10 MG tablet Commonly known as:  FLEXERIL Take 10 mg by mouth at bedtime.   fluticasone 50 MCG/ACT nasal spray Commonly known as:  FLONASE Place 2 sprays into both nostrils daily as needed for allergies.   furosemide 40 MG tablet Commonly known as:  LASIX Take 1 tablet (40 mg total) by mouth daily.   gabapentin 300 MG capsule Commonly known as:  NEURONTIN Take 300 mg by mouth 3 (three) times daily.   metoprolol succinate 50 MG 24 hr tablet Commonly known as:  TOPROL-XL Take 50 mg by mouth daily.   omeprazole 20 MG capsule Commonly known as:  PRILOSEC Take 20 mg by mouth daily.   oxyCODONE 5 MG immediate release tablet Commonly known as:  Oxy IR/ROXICODONE Take 1-2 tablets (5-10 mg total) by mouth every 6 (six) hours as needed for severe pain.   potassium chloride SA 20 MEQ tablet Commonly known as:  K-DUR,KLOR-CON Take 1 tablet (20 mEq total) by mouth daily.   rivaroxaban 20 MG Tabs tablet Commonly known as:  XARELTO Take 1 tablet (20 mg total) by mouth daily. Start taking on:  08/24/2016   SALINE SENSITIVE EYES Soln Place 1 drop into both eyes 4 (four) times daily as needed. Dry eyes      Follow-up Information    Gaye Pollack, MD Follow up in 4 week(s).   Specialty:  Cardiothoracic Surgery Why:  Office will contact you with appointment date and time.  Please get CXR 30 minutes prior to your appointment with Dr. Cyndia Bent at [facility] imaging located on first floor of our office building. Contact information: [address] [place] [address] [place]  Cyndia Bent will also see in office this Friday and remove sutures 2:30 pm  Signed: GOLD,WAYNE E 08/23/2016, 11:48 AM

## 2016-08-23 MED ORDER — FUROSEMIDE 40 MG PO TABS: 40.0000 mg | ORAL_TABLET | Freq: Every day | ORAL | 0 refills | Status: DC

## 2016-08-23 MED ORDER — OXYCODONE HCL 5 MG PO TABS: 5.0000 mg | ORAL_TABLET | Freq: Four times a day (QID) | ORAL | 0 refills | Status: DC | PRN

## 2016-08-23 MED ORDER — POTASSIUM CHLORIDE CRYS ER 20 MEQ PO TBCR: 20.0000 meq | EXTENDED_RELEASE_TABLET | Freq: Every day | ORAL | 0 refills | Status: DC

## 2016-08-23 MED ORDER — RIVAROXABAN 20 MG PO TABS: 20.0000 mg | ORAL_TABLET | Freq: Every day | ORAL | 1 refills | Status: AC

## 2016-08-23 MED ORDER — TRAMADOL HCL 50 MG PO TABS: 50.0000 mg | ORAL_TABLET | Freq: Four times a day (QID) | ORAL | 0 refills | Status: AC | PRN

## 2016-08-23 MED ORDER — RIVAROXABAN 20 MG PO TABS
20.0000 mg | ORAL_TABLET | Freq: Every day | ORAL | Status: DC
  Administered 2016-08-23: 20 mg via ORAL
  Filled 2016-08-23: qty 1

## 2016-08-23 NOTE — Progress Notes (Addendum)
[address]       [place] [address]             [phone]      4 Days Post-Op Procedure(s) (LRB): BENTALL PROCEDURE WITH CIRC ARREST (N/A) REPLACEMENT ASCENDING AORTA (N/A) CLIPPING OF ATRIAL APPENDAGE - usingAtriClip Pro 40 (N/A) TRANSESOPHAGEAL ECHOCARDIOGRAM (TEE) (N/A) Subjective: Looks and feels pretty well, interm afib, xarelto restarted, rate control is pretty good  Objective: Vital signs in last 24 hours: Temp:  [99.8 F (37.7 C)-100.3 F (37.9 C)] 100.3 F (37.9 C) (01/14 1944) Pulse Rate:  [87-110] 87 (01/15 0949) Cardiac Rhythm: Normal sinus rhythm (01/14 1940) Resp:  [16-18] 18 (01/14 1944) BP: (103-105)/(59-64) 103/59 (01/15 0949) SpO2:  [98 %-99 %] 99 % (01/14 1944) Weight:  [198 lb 13.7 oz (90.2 kg)] 198 lb 13.7 oz (90.2 kg) (01/15 0548)  Hemodynamic parameters for last 24 hours:    Intake/Output from previous day: 01/14 0701 - 01/15 0700 In: 670 [P.O.:670] Out: -  Intake/Output this shift: Total I/O In: 240 [P.O.:240] Out: -   General appearance: alert, cooperative and no distress Heart: regular rate and rhythm and soft systolic murmur Lungs: min dim in bases Abdomen: benign Extremities: + LE edema Wound: incis healing well  Lab Results:  Recent Labs  08/20/16 1610 08/20/16 1614 08/21/16 0500  WBC 16.5*  --  14.4*  HGB 11.2* 11.2* 10.1*  HCT 33.3* 33.0* 30.2*  PLT 97*  --  88*   BMET:  Recent Labs  08/20/16 1614 08/21/16 0500  NA 138 136  K 4.3 4.0  CL 104 104  CO2  --  23  GLUCOSE 162* 136*  BUN 16 15  CREATININE 0.70 0.84  CALCIUM  --  8.5*    PT/INR: No results for input(s): LABPROT, INR in the last 72 hours. ABG    Component Value Date/Time   PHART 7.390 08/19/2016 1811   HCO3 19.6 (L) 08/19/2016 1811   TCO2 20 08/20/2016 1614   ACIDBASEDEF 5.0 (H) 08/19/2016 1811   O2SAT 100.0 08/19/2016 1811   CBG (last 3)  No results for input(s): GLUCAP in the last 72 hours.  Meds Scheduled  Meds: . acetaminophen  1,000 mg Oral Q6H   Or  . acetaminophen (TYLENOL) oral liquid 160 mg/5 mL  1,000 mg Per Tube Q6H  . bisacodyl  10 mg Oral Daily   Or  . bisacodyl  10 mg Rectal Daily  . cyclobenzaprine  10 mg Oral QHS  . docusate sodium  200 mg Oral Daily  . gabapentin  300 mg Oral TID  . loratadine  10 mg Oral Daily  . metoprolol tartrate  12.5 mg Oral BID  . moving right along book   Does not apply Once  . pantoprazole  40 mg Oral Daily  . potassium chloride  20 mEq Oral BID  . rivaroxaban  20 mg Oral Daily  . sodium chloride flush  3 mL Intravenous Q12H  . sodium chloride flush  3 mL Intravenous Q12H   Continuous Infusions: . sodium chloride Stopped (08/20/16 1000)  . sodium chloride    . sodium chloride Stopped (08/20/16 1000)  . lactated ringers Stopped (08/21/16 1100)  . lactated ringers Stopped (08/20/16 0800)   PRN Meds:.sodium chloride, sodium chloride, acetaminophen, fluticasone, lactated ringers, metoprolol, ondansetron **OR** ondansetron (ZOFRAN) IV, oxyCODONE, polyvinyl alcohol, sodium chloride flush, sodium chloride flush, traMADol  Xrays No results found.  Assessment/Plan: S/P Procedure(s) (LRB): BENTALL PROCEDURE WITH CIRC ARREST (  N/A) REPLACEMENT ASCENDING AORTA (N/A) CLIPPING OF ATRIAL APPENDAGE - usingAtriClip Pro 40 (N/A) TRANSESOPHAGEAL ECHOCARDIOGRAM (TEE) (N/A)  1 conts to do well, seen by [facility] this am- he is stable for discharge   LOS: 4 days    GOLD,WAYNE E 08/23/2016   Chart reviewed, patient examined, agree with above. He is doing well, slept well and feels well this am. Plan to send home. Start Xarelto 20 mg daily today. I will see in the office Friday with CXR

## 2016-08-23 NOTE — Progress Notes (Signed)
Per insurance check regarding Xarelto continued coverage [facility] @ [facility] # [phone]   XARELTO 20 MG   COVER- YES  CO-PAY- $ 40.00  TIER- 2 DRUG  PRIOR APPROVAL- NO  PH[facility]ACY : [facility]

## 2016-08-23 NOTE — Care Management Note (Signed)
Case Management Note Marvetta Gibbons RN, BSN Unit 2W-Case Manager [phone]  Patient Details  Name: Clinton Pearson MRN: [id number] Date of Birth: 03/14/1951  Subjective/Objective:   Pt admitted s/p AVR                 Action/Plan: PTA pt lived at home - plan to d/c home- referral received regarding- Patient on Xarelto preop (hx of afib)... Now with valve in place, possibility insurance wont cover anymore, asked to investigate this-- want to know if insurance will still cover Xarelto 20 mg for Afib now that pt has heart valve in place-- per insurance check [facility] @ [facility] # [phone]   XARELTO 20 MG   COVER- YES  CO-PAY- $ 40.00  TIER- 2 DRUG  PRIOR APPROVAL- NO  [facility] : [facility]     Expected Discharge Date:  08/23/16               Expected Discharge Plan:  Home/Self Care  In-House Referral:     Discharge planning Services  CM Consult, Medication Assistance  Post Acute Care Choice:    Choice offered to:     DME Arranged:    DME Agency:     HH Arranged:    [place] Agency:     Status of Service:  Completed, signed off  If discussed at [employer], dates discussed:    Additional Comments:  Dawayne Patricia, RN 08/23/2016, 1:18 PM

## 2016-08-23 NOTE — Progress Notes (Signed)
[id number] Pt has been walking independently about 1000 ft at a time. Stated he felt good walking. Education completed with pt and wife who voiced understanding. Discussed atrial fib and need for anticoagulation. Gave Off the Beat booklet. Encouraged IS and ex ed. Gave heart healthy diet sheet. Put on discharge video. Pt is aware that if he is on Coumadin that he will need to watch Vitamin K. Wrote down how to view Coumadin video. Pt has a cardiologist in [place] and the CRP 2 program is at the [facility] in [place] per pt's wife. Pt and wife will discuss with Dr Mellody Memos and have her refer to program. Graylon Good RN BSN 08/23/2016 9:47 AM

## 2016-08-24 ENCOUNTER — Other Ambulatory Visit: Payer: Self-pay | Admitting: Surgery

## 2016-08-24 DIAGNOSIS — Z952 Presence of prosthetic heart valve: Secondary | ICD-10-CM

## 2016-08-26 ENCOUNTER — Telehealth: Payer: Self-pay | Admitting: Surgical

## 2016-08-26 MED FILL — Heparin Sodium (Porcine) Inj 1000 Unit/ML: INTRAMUSCULAR | Qty: 2500 | Status: AC

## 2016-08-26 NOTE — Telephone Encounter (Signed)
      [address]       [place] [address]             [place] [id number]   08/19/2016  Surgeon:  Gaye Pollack, MD  First Assistant: Ellwood Handler,  PA-C   Preoperative Diagnosis:    5 cm aortic root and ascending aortic aneurysm with severe aortic insufficiency   Postoperative Diagnosis:  Same   Procedure:  1. Median Sternotomy 2. Extracorporeal circulation 3.   Replacement of the ascending aorta (hemi-arch) using a 28 mm Hemashield graft under deep hypothermic circulatory arrest 4.   Biological Bentall Procedure using a 25 mm Edwards Magna-Ease pericardial valve and a 28 mm Gelweave Valsalva graft. 5.  Placement of 35 mm Atricure left atrial clip.  Anesthesia:  General Endotracheal  Discharged home on 08/23/16  Medications: Allergies as of 08/26/2016      Reactions   Adhesive [tape] Other (See Comments)   Skin irritation, and tears at skin - please use paper tape      Medication List       Accurate as of 08/26/16  2:49 PM. Always use your most recent med list.          acetaminophen 500 MG tablet Commonly known as:  TYLENOL Take 500 mg by mouth 3 (three) times daily.   cetirizine 10 MG tablet Commonly known as:  ZYRTEC Take 10 mg by mouth daily.   cyclobenzaprine 10 MG tablet Commonly known as:  FLEXERIL Take 10 mg by mouth at bedtime.   fluticasone 50 MCG/ACT nasal spray Commonly known as:  FLONASE Place 2 sprays into both nostrils daily as needed for allergies.   furosemide 40 MG tablet Commonly known as:  LASIX Take 1 tablet (40 mg total) by mouth daily.   gabapentin 300 MG capsule Commonly known as:  NEURONTIN Take 300 mg by mouth 3 (three) times daily.   metoprolol succinate 50 MG 24 hr tablet Commonly known as:  TOPROL-XL Take 50 mg by mouth daily.   omeprazole 20 MG capsule Commonly known as:  PRILOSEC Take 20 mg by mouth daily.   potassium chloride SA 20 MEQ  tablet Commonly known as:  K-DUR,KLOR-CON Take 1 tablet (20 mEq total) by mouth daily.   rivaroxaban 20 MG Tabs tablet Commonly known as:  XARELTO Take 1 tablet (20 mg total) by mouth daily.   SALINE SENSITIVE EYES Soln Place 1 drop into both eyes 4 (four) times daily as needed. Dry eyes   traMADol 50 MG tablet Commonly known as:  ULTRAM Take 1 tablet (50 mg total) by mouth every 6 (six) hours as needed for moderate pain.      Coumadin:no  Problems/Concerns: doing well, mild soreness, using tylenol prn  Assessment:  Patient is doing well.   Further instructions provided.  Contact office if concerns or problems develop  Follow up Appointment: scheduled, including tomorrow at our office  Adalae Baysinger E, PA-C

## 2016-08-27 ENCOUNTER — Ambulatory Visit (INDEPENDENT_AMBULATORY_CARE_PROVIDER_SITE_OTHER): Payer: Self-pay | Admitting: Physician Assistant

## 2016-08-27 ENCOUNTER — Ambulatory Visit
Admission: RE | Admit: 2016-08-27 | Discharge: 2016-08-27 | Disposition: A | Discharge status: Home / Self Care | Payer: Medicare Other | Source: Ambulatory Visit | Attending: Surgery | Admitting: Surgery

## 2016-08-27 ENCOUNTER — Encounter: Payer: Self-pay | Admitting: Physician Assistant

## 2016-08-27 VITALS — BP 108/67 | HR 94 | Resp 20 | Ht 75.0 in | Wt 198.0 lb

## 2016-08-27 DIAGNOSIS — I712 Thoracic aortic aneurysm, without rupture, unspecified: Secondary | ICD-10-CM

## 2016-08-27 DIAGNOSIS — I77810 Thoracic aortic ectasia: Secondary | ICD-10-CM

## 2016-08-27 DIAGNOSIS — I351 Nonrheumatic aortic (valve) insufficiency: Secondary | ICD-10-CM

## 2016-08-27 DIAGNOSIS — Z952 Presence of prosthetic heart valve: Secondary | ICD-10-CM

## 2016-08-27 DIAGNOSIS — Z09 Encounter for follow-up examination after completed treatment for conditions other than malignant neoplasm: Secondary | ICD-10-CM

## 2016-08-27 NOTE — Progress Notes (Signed)
HPI:  Patient reports for CXR and chest tube suture removal.  He is S/P Bentall procedure performed on 08/18/2016.  He developed some atrial fibrillation which he had preoperatively.  His hospital stay was otherwise uncomplicated.  He was restarted on his Eliquis and discharged home on 08/23/2016.  He states he is doing okay.  He has multiple concerns and complaints including fatigue, not being able to drive, soreness, lack of appetite, and feeling cold all the time.  Current Outpatient Prescriptions  Medication Sig Dispense Refill  . acetaminophen (TYLENOL) 500 MG tablet Take 500 mg by mouth 3 (three) times daily.    . cetirizine (ZYRTEC) 10 MG tablet Take 10 mg by mouth daily.    . cyclobenzaprine (FLEXERIL) 10 MG tablet Take 10 mg by mouth at bedtime.     . fluticasone (FLONASE) 50 MCG/ACT nasal spray Place 2 sprays into both nostrils daily as needed for allergies.     . furosemide (LASIX) 40 MG tablet Take 1 tablet (40 mg total) by mouth daily. 7 tablet 0  . gabapentin (NEURONTIN) 300 MG capsule Take 300 mg by mouth 3 (three) times daily.    . metoprolol succinate (TOPROL-XL) 50 MG 24 hr tablet Take 50 mg by mouth daily.  3  . omeprazole (PRILOSEC) 20 MG capsule Take 20 mg by mouth daily.    . potassium chloride SA (K-DUR,KLOR-CON) 20 MEQ tablet Take 1 tablet (20 mEq total) by mouth daily. 7 tablet 0  . rivaroxaban (XARELTO) 20 MG TABS tablet Take 1 tablet (20 mg total) by mouth daily. 30 tablet 1  . Soft Lens Products (SALINE SENSITIVE EYES) SOLN Place 1 drop into both eyes 4 (four) times daily as needed. Dry eyes    . traMADol (ULTRAM) 50 MG tablet Take 1 tablet (50 mg total) by mouth every 6 (six) hours as needed for moderate pain. 30 tablet 0   No current facility-administered medications for this visit.     Physical Exam:  BP 108/67   Pulse 94   Resp 20   Ht 6\' 3"  (1.905 m)   Wt 198 lb (89.8 kg)   SpO2 98% Comment: RA  BMI 24.75 kg/m   Gen: no apparent distress Heart:  RRR Lungs: CTA bilaterally Abd: soft non-tender, non-distended Ext: minimal edema Incisions: Well healed  Diagnostic Tests:  CXR: improvement of effusions, vascular congestion, sternal wires intact  A/P:  1. Patient is doing very well.  He is only about week out from surgery and he has unrealistic expectations about his recovery.  I addressed each question/concern at length with the patient and his wife.  Everything they have been experiencing are normal aspects of the recovery process.  The patient was encouraged to keep ambulating as tolerated.  His diet should improve once his taste buds recover from intubation.  In regards to driving the patient was counseled on importance of refraining from driving for minimum of 2 weeks and be off all narcotics before he attempts to drive.  He will follow up with his Cardiologist once he can get his appointment set up.  They have a better understanding in regards to his recovery process.  2. He has been instructed to contact our office with questions and concerns.  He was also instructed to contact our office should he not feel he is making progress and we can bring him back to check on him sooner.  3. RTC in 4 weeks with repeat CXR  Ellwood Handler, PA-C [facility] [phone]

## 2016-09-02 ENCOUNTER — Inpatient Hospital Stay (HOSPITAL_COMMUNITY)
Admission: EM | Admit: 2016-09-02 | Discharge: 2016-09-06 | DRG: 310 | Disposition: A | Discharge status: Home / Self Care | Payer: Medicare Other | Attending: Cardiovascular Disease | Admitting: Cardiovascular Disease

## 2016-09-02 DIAGNOSIS — Z86718 Personal history of other venous thrombosis and embolism: Secondary | ICD-10-CM

## 2016-09-02 DIAGNOSIS — Z823 Family history of stroke: Secondary | ICD-10-CM

## 2016-09-02 DIAGNOSIS — M109 Gout, unspecified: Secondary | ICD-10-CM | POA: Diagnosis present

## 2016-09-02 DIAGNOSIS — M170 Bilateral primary osteoarthritis of knee: Secondary | ICD-10-CM | POA: Diagnosis present

## 2016-09-02 DIAGNOSIS — I480 Paroxysmal atrial fibrillation: Secondary | ICD-10-CM | POA: Diagnosis not present

## 2016-09-02 DIAGNOSIS — Z86711 Personal history of pulmonary embolism: Secondary | ICD-10-CM | POA: Diagnosis not present

## 2016-09-02 DIAGNOSIS — I350 Nonrheumatic aortic (valve) stenosis: Secondary | ICD-10-CM | POA: Diagnosis not present

## 2016-09-02 DIAGNOSIS — I4891 Unspecified atrial fibrillation: Secondary | ICD-10-CM

## 2016-09-02 DIAGNOSIS — I483 Typical atrial flutter: Secondary | ICD-10-CM | POA: Diagnosis not present

## 2016-09-02 DIAGNOSIS — I214 Non-ST elevation (NSTEMI) myocardial infarction: Secondary | ICD-10-CM

## 2016-09-02 DIAGNOSIS — Z7901 Long term (current) use of anticoagulants: Secondary | ICD-10-CM

## 2016-09-02 DIAGNOSIS — I4892 Unspecified atrial flutter: Secondary | ICD-10-CM | POA: Diagnosis not present

## 2016-09-02 DIAGNOSIS — Z952 Presence of prosthetic heart valve: Secondary | ICD-10-CM

## 2016-09-02 LAB — COMPREHENSIVE METABOLIC PANEL
ALBUMIN: 3.1 g/dL — AB (ref 3.5–5.0)
ALK PHOS: 100 U/L (ref 38–126)
ALT: 21 U/L (ref 17–63)
ANION GAP: 9 (ref 5–15)
AST: 20 U/L (ref 15–41)
BILIRUBIN TOTAL: 0.7 mg/dL (ref 0.3–1.2)
BUN: 17 mg/dL (ref 6–20)
CALCIUM: 9.4 mg/dL (ref 8.9–10.3)
CO2: 22 mmol/L (ref 22–32)
CREATININE: 1.19 mg/dL (ref 0.61–1.24)
Chloride: 108 mmol/L (ref 101–111)
GFR calc non Af Amer: >60 mL/min (ref >60)
GLUCOSE: 100 mg/dL — AB (ref 65–99)
Potassium: 4.3 mmol/L (ref 3.5–5.1)
Sodium: 139 mmol/L (ref 135–145)
Total Protein: 6.1 g/dL — ABNORMAL LOW (ref 6.5–8.1)

## 2016-09-02 LAB — PROTIME-INR
INR: 1.55
Prothrombin Time: 18.7 seconds — ABNORMAL HIGH (ref 11.4–15.2)

## 2016-09-02 LAB — TSH: TSH: 0.849 u[IU]/mL (ref 0.350–4.500)

## 2016-09-02 LAB — T4, FREE: Free T4: 0.95 ng/dL (ref 0.61–1.12)

## 2016-09-02 LAB — TROPONIN I: TROPONIN I: 0.42 ng/mL — AB (ref <0.03)

## 2016-09-02 LAB — MRSA PCR SCREENING: MRSA BY PCR: NEGATIVE

## 2016-09-02 LAB — BRAIN NATRIURETIC PEPTIDE: B Natriuretic Peptide: 419.3 pg/mL — ABNORMAL HIGH (ref 0.0–100.0)

## 2016-09-02 MED ORDER — ONDANSETRON HCL 4 MG/2ML IJ SOLN: 4.0000 mg | Freq: Four times a day (QID) | INTRAMUSCULAR | Status: DC | PRN

## 2016-09-02 MED ORDER — METOPROLOL SUCCINATE ER 25 MG PO TB24
50.0000 mg | ORAL_TABLET | Freq: Every day | ORAL | Status: DC
  Administered 2016-09-02 – 2016-09-04 (×3): 50 mg via ORAL
  Filled 2016-09-02 (×3): qty 2

## 2016-09-02 MED ORDER — RIVAROXABAN 20 MG PO TABS
20.0000 mg | ORAL_TABLET | Freq: Every day | ORAL | Status: DC
  Filled 2016-09-02: qty 1

## 2016-09-02 MED ORDER — RIVAROXABAN 20 MG PO TABS: 20.0000 mg | ORAL_TABLET | Freq: Every day | ORAL | Status: DC

## 2016-09-02 MED ORDER — FUROSEMIDE 40 MG PO TABS
40.0000 mg | ORAL_TABLET | Freq: Every day | ORAL | Status: DC
  Administered 2016-09-03 – 2016-09-06 (×4): 40 mg via ORAL
  Filled 2016-09-02: qty 2
  Filled 2016-09-02: qty 1
  Filled 2016-09-02 (×3): qty 2

## 2016-09-02 MED ORDER — GABAPENTIN 300 MG PO CAPS
300.0000 mg | ORAL_CAPSULE | Freq: Three times a day (TID) | ORAL | Status: DC
  Administered 2016-09-02 – 2016-09-06 (×12): 300 mg via ORAL
  Filled 2016-09-02 (×12): qty 1

## 2016-09-02 MED ORDER — DEXTROSE 5 % IV SOLN
5.0000 mg/h | INTRAVENOUS | Status: DC
  Administered 2016-09-02: 5 mg/h via INTRAVENOUS
  Administered 2016-09-03: 12.5 mg/h via INTRAVENOUS
  Filled 2016-09-02 (×2): qty 100

## 2016-09-02 MED ORDER — TRAMADOL HCL 50 MG PO TABS
50.0000 mg | ORAL_TABLET | Freq: Four times a day (QID) | ORAL | Status: DC | PRN
  Administered 2016-09-06: 50 mg via ORAL
  Filled 2016-09-02: qty 1

## 2016-09-02 MED ORDER — ACETAMINOPHEN 325 MG PO TABS: 650.0000 mg | ORAL_TABLET | ORAL | Status: DC | PRN

## 2016-09-02 MED ORDER — CYCLOBENZAPRINE HCL 10 MG PO TABS
10.0000 mg | ORAL_TABLET | Freq: Every day | ORAL | Status: DC
  Administered 2016-09-02 – 2016-09-05 (×4): 10 mg via ORAL
  Filled 2016-09-02 (×5): qty 1

## 2016-09-02 MED ORDER — PANTOPRAZOLE SODIUM 40 MG PO TBEC
40.0000 mg | DELAYED_RELEASE_TABLET | Freq: Every day | ORAL | Status: DC
  Administered 2016-09-02 – 2016-09-06 (×5): 40 mg via ORAL
  Filled 2016-09-02 (×5): qty 1

## 2016-09-02 MED ORDER — ACETAMINOPHEN 325 MG PO TABS
650.0000 mg | ORAL_TABLET | ORAL | Status: DC | PRN
  Administered 2016-09-03 – 2016-09-05 (×3): 650 mg via ORAL
  Filled 2016-09-02 (×4): qty 2

## 2016-09-02 NOTE — Telephone Encounter (Signed)
Mr. Rohrbaugh called and left a message regarding symptoms he was having....fast heart rate and a headache he was having for a few days. Before I could call him back, his wife called and said he was going to [employer] via EMS with heart racing, nausea and sweats.

## 2016-09-02 NOTE — H&P (Signed)
History & Physical    Patient ID: Clinton Pearson MRN: [id number], DOB/AGE: 1950/11/26   Admit date: 09/02/2016   Primary Physician: Pcp Not In System Primary Cardiologist: [place]  Patient Profile  Afib RVR  Past Medical History    Past Medical History:  Diagnosis Date  . Aortic regurgitation 9/23,2016 10/28/2015   MODERATE PER ECHOES  . Arthritis    WITH SPINAL INJURY  . Cancer ([place])    SKIN   . Depression   . Dilated aortic root ([place]) 03/2015   PER CT CHEST  . Dysrhythmia    FEEL A FLUTTER  . GERD (gastroesophageal reflux disease)   . Headache    MIGRAINES   . Personal history of PE (pulmonary embolism)    BILATERAL  . PONV (postoperative nausea and vomiting)   . Thoracic ascending aortic aneurysm ([place]) 03/2015   PER CT CHEST    Past Surgical History:  Procedure Laterality Date  . BENTALL PROCEDURE N/A 08/19/2016   Procedure: BENTALL PROCEDURE WITH CIRC ARREST;  Surgeon: Gaye Pollack, MD;  Location: [place];  Service: Open Heart Surgery;  Laterality: N/A;  . CARDIAC CATHETERIZATION N/A 08/11/2016   Procedure: Right/Left Heart Cath and Coronary Angiography;  Surgeon: Burnell Blanks, MD;  Location: [place] CV LAB;  Service: Cardiovascular;  Laterality: N/A;  . CLIPPING OF ATRIAL APPENDAGE N/A 08/19/2016   Procedure: CLIPPING OF ATRIAL APPENDAGE - usingAtriClip Pro 40;  Surgeon: Gaye Pollack, MD;  Location: [place];  Service: Open Heart Surgery;  Laterality: N/A;  . ELBOW SURGERY     LEFT X3   . HAND SURGERY    . KNEE SURGERY     ARTHROSCOPY BIL  3 X EACH  . REPLACEMENT ASCENDING AORTA N/A 08/19/2016   Procedure: REPLACEMENT ASCENDING AORTA;  Surgeon: Gaye Pollack, MD;  Location: [facility];  Service: Open Heart Surgery;  Laterality: N/A;  . SHOULDER SURGERY     3 LEFT  . TEE WITHOUT CARDIOVERSION N/A 08/19/2016   Procedure: TRANSESOPHAGEAL ECHOCARDIOGRAM (TEE);  Surgeon: Gaye Pollack, MD;  Location: [facility];  Service: Open Heart Surgery;  Laterality: N/A;      Allergies  Allergies  Allergen Reactions  . Adhesive [Tape] Other (See Comments)    Skin irritation, and tears at skin - please use paper tape    History of Present Illness    Clinton. Pearson is a 66 yo white male who presents from [place] as a transfer for Afi with RVR. He was discharged on 1/14 after a Bentall procedure. He had an uncomplicated post op course. Did develop afib , which however he was known to have before. 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  He was started on Xarelto.  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  Today he developed paplpiatations, sweating and headache in the morning. Symptoms only worsened and he went to ED. HR in 140's. Trop + with 0.3. Dr. Oval Linsey accepted transfer. Started on dilt drip at OSH which brought the rate down to 110's. (Afib. He denies any missed meds, no new stressors. No CP, PND, orthopnea or LEE.;   Home Medications    Prior to Admission medications   Medication Sig Start Date End Date Taking? Authorizing Provider  acetaminophen (TYLENOL) 500 MG tablet Take 500 mg by mouth 3 (  three) times daily.    Historical Provider, MD  cetirizine (ZYRTEC) 10 MG tablet Take 10 mg by mouth daily.    Historical Provider, MD  cyclobenzaprine (FLEXERIL) 10 MG tablet Take 10 mg by mouth at bedtime.     Historical Provider, MD  fluticasone (FLONASE) 50 MCG/ACT nasal spray Place 2 sprays into both nostrils daily as needed for allergies.     Historical Provider, MD  furosemide (LASIX) 40 MG tablet Take 1 tablet (40 mg total) by mouth daily. 08/23/16   Wayne E Gold, PA-C  gabapentin (NEURONTIN) 300 MG capsule Take 300 mg by mouth 3 (three) times daily.    Historical Provider, MD  metoprolol succinate (TOPROL-XL) 50 MG 24 hr tablet Take 50 mg by mouth daily. 07/15/16   Historical Provider, MD    omeprazole (PRILOSEC) 20 MG capsule Take 20 mg by mouth daily.    Historical Provider, MD  potassium chloride SA (K-DUR,KLOR-CON) 20 MEQ tablet Take 1 tablet (20 mEq total) by mouth daily. 08/23/16   Wayne E Gold, PA-C  rivaroxaban (XARELTO) 20 MG TABS tablet Take 1 tablet (20 mg total) by mouth daily. 08/24/16   John Giovanni, PA-C  Soft Lens Products (SALINE SENSITIVE EYES) SOLN Place 1 drop into both eyes 4 (four) times daily as needed. Dry eyes    Historical Provider, MD  traMADol (ULTRAM) 50 MG tablet Take 1 tablet (50 mg total) by mouth every 6 (six) hours as needed for moderate pain. 08/23/16   Elgie Collard, PA-C    Family History    Family History  Problem Relation Age of Onset  . CVA Father   . Deep vein thrombosis Sister     Social History    Social History   Social History  . Marital status: Married    Spouse name: N/A  . Number of children: N/A  . Years of education: N/A   Occupational History  . Not on file.   Social History Main Topics  . Smoking status: Never Smoker  . Smokeless tobacco: Never Used  . Alcohol use 0.0 oz/week     Comment: 3-4 BEERS/WEEK  . Drug use: No  . Sexual activity: Not on file   Other Topics Concern  . Not on file   Social History Narrative  . No narrative on file     Review of Systems    General:  No chills, fever, night sweats or weight changes.  Cardiovascular:  No chest pain, dyspnea on exertion, edema, orthopnea, palpitations, paroxysmal nocturnal dyspnea. Dermatological: No rash, lesions/masses Respiratory: No cough, dyspnea Urologic: No hematuria, dysuria Abdominal:   No nausea, vomiting, diarrhea, bright red blood per rectum, melena, or hematemesis Neurologic:  No visual changes, wkns, changes in mental status. All other systems reviewed and are otherwise negative except as noted above.  Physical Exam    There were no vitals taken for this visit.  General: Pleasant, NAD Psych: Normal affect. Neuro: Alert and  oriented X 3. Moves all extremities spontaneously. HEENT: Normal  Neck: Supple without bruits or JVD. Lungs:  Resp regular and unlabored, CTA. Heart: irregular 120. Abdomen: Soft, non-tender, non-distended, BS + x 4.  Extremities: No clubbing, cyanosis or edema. DP/PT/Radials 2+ and equal bilaterally.  Labs    Troponin (Point of Care Test) No results for input(s): TROPIPOC in the last 72 hours. No results for input(s): CKTOTAL, CKMB, TROPONINI in the last 72 hours. Lab Results  Component Value Date   WBC 14.4 (H) 08/21/2016  HGB 10.1 (L) 08/21/2016   HCT 30.2 (L) 08/21/2016   MCV 91.2 08/21/2016   PLT 88 (L) 08/21/2016   No results for input(s): NA, K, CL, CO2, BUN, CREATININE, CALCIUM, PROT, BILITOT, ALKPHOS, ALT, AST, GLUCOSE in the last 168 hours.  Invalid input(s): LABALBU No results found for: CHOL, HDL, LDLCALC, TRIG No results found for: DDIMER   Radiology Studies    Dg Chest 2 View  Result Date: 08/27/2016 CLINICAL DATA:  Aortic valve replacement EXAM: CHEST  2 VIEW COMPARISON:  08/21/2016 FINDINGS: Right IJ vascular sheath removed. Postop changes from aortic valve replacement and atrial appendage clip. Normal heart size and vascularity. Previous edema and basilar atelectasis resolved. Trace pleural effusions noted but have improved. No pneumothorax. Trachea is midline. Degenerative changes of the spine. IMPRESSION: Postop changes from aortic valve replacement. Improved basilar aeration with resolving vascular congestion, basilar atelectasis, and pleural effusions. No pneumothorax. Electronically Signed   By: Jerilynn Mages.  Shick M.D.   On: 08/27/2016 14:17   Dg Chest 2 View  Result Date: 08/17/2016 CLINICAL DATA:  Thoracic aortic aneurysm. Aortic valve insufficiency. EXAM: CHEST  2 VIEW COMPARISON:  Chest CTA 08/08/2016 FINDINGS: Ascending aortic aneurysm under appreciated relative to recent CT. Normal heart size. The aorta is tortuous. There is no edema, consolidation, effusion, or  pneumothorax. IMPRESSION: Stable compared to recent chest CTA.  No evidence of active disease. Electronically Signed   By: Monte Fantasia M.D.   On: 08/17/2016 14:13   Dg Chest Port 1 View  Result Date: 08/21/2016 CLINICAL DATA:  Postop aortic valve replacement EXAM: PORTABLE CHEST 1 VIEW COMPARISON:  August 20, 2016 FINDINGS: The PA catheter is has been removed. A right IJ sheath remains in place. Small bilateral effusions with underlying atelectasis. Stable cardiomediastinal silhouette. Mild pulmonary venous congestion not excluded. IMPRESSION: 1. Small bilateral pleural effusions, more prominent in the interval, with suggested pulmonary venous congestion. Electronically Signed   By: Dorise Bullion III M.D   On: 08/21/2016 07:56   Dg Chest Port 1 View  Result Date: 08/20/2016 CLINICAL DATA:  AVR.  Sore chest . EXAM: PORTABLE CHEST 1 VIEW COMPARISON:  08/19/2016 . FINDINGS: Interim extubation and removal of NG tube. Mediastinal drainage catheter and Swan-Ganz catheter in stable position. Prior cardiac valve replacement. Left atrial appendage clip noted. Stable cardiomegaly. Mild bibasilar subsegmental atelectasis. Small left pleural effusion cannot be excluded. No pneumothorax. IMPRESSION: 1. Interim extubation and removal of NG tube. Remaining lines and tubes in stable position. 2.  Prior cardiac valve replacement.  Stable cardiomegaly. 3. Low lung volumes with mild bibasilar atelectasis . Small left pleural effusion. Electronically Signed   By: Marcello Moores  Register   On: 08/20/2016 07:32   Dg Chest Port 1 View  Result Date: 08/19/2016 CLINICAL DATA:  Status post AVR, ascending aortic replacement EXAM: PORTABLE CHEST 1 VIEW COMPARISON:  08/17/2016 FINDINGS: Interval intubation, tip of the endotracheal tube is approximately 5.6 cm superior to the carina. Interim postsurgical changes of the mediastinum with valve prosthetic noted. Placement of chest drainage catheters. Esophageal tube extends below the  diaphragm but the tip is not included. Right jugular Swan-Ganz catheter tip is directed to the right and overlies the central pulmonary outflow tract. Left lower lobe atelectasis or infiltrate. Non inclusion of the left CP angle. Stable cardiomediastinal silhouette. Mild right cardiophrenic lucency is similar compared to preoperative radiograph and is therefore felt doubtful for a pneumothorax. A vertical lucency paralleling the mediastinum could relate to a small amount of postsurgical air or  possibly a skin fold artifact. Mild left apical pleural opacity. IMPRESSION: 1. Support lines and tubes as described above with interval postsurgical changes of the mediastinum. 2. Left lower lobe consolidation could reflect atelectasis or infiltrate. Mild left apical pleural opacity could reflect small amount of fluid. Electronically Signed   By: Donavan Foil M.D.   On: 08/19/2016 15:08    ECG & Cardiac Imaging    Afib RVR, rate 120, no acute ischemic changes  Assessment & Plan    Clinton Pollak is post Deneen Harts, has a history of DVT/PE and Afib. Now Afib with RVR. Also with evidence of flutter. Also with NSTEMI which is likely a type 2 event. LHC this month was normal. TEE intra op with preserved LVEF.   Plan:  - Continue dilt drip for now - Cont home metop - Cont home rivaroxaban  Signed, Cristina Gong, MD 09/02/2016, 9:26 PM

## 2016-09-03 ENCOUNTER — Inpatient Hospital Stay (HOSPITAL_COMMUNITY): Payer: Medicare Other

## 2016-09-03 DIAGNOSIS — I480 Paroxysmal atrial fibrillation: Principal | ICD-10-CM

## 2016-09-03 DIAGNOSIS — Z952 Presence of prosthetic heart valve: Secondary | ICD-10-CM

## 2016-09-03 DIAGNOSIS — I4891 Unspecified atrial fibrillation: Secondary | ICD-10-CM

## 2016-09-03 DIAGNOSIS — I350 Nonrheumatic aortic (valve) stenosis: Secondary | ICD-10-CM

## 2016-09-03 DIAGNOSIS — I483 Typical atrial flutter: Secondary | ICD-10-CM

## 2016-09-03 LAB — ECHOCARDIOGRAM COMPLETE
AOPV: 0.62 m/s
AV Area VTI index: 1.32 cm2/m2
AV Mean grad: 13 mmHg
AV VEL mean LVOT/AV: 0.72
AV peak Index: 1.39
AVAREAMEANVIN: 1.61 cm2/m2
AVLVOTPG: 10 mmHg
AVPG: 26 mmHg
AVPKVEL: 255 cm/s
Ao-asc: 30 cm
Area-P 1/2: 4.31 cm2
CHL CUP DOP CALC LVOT VTI: 24.3 cm
DOP CAL AO MEAN VELOCITY: 159 cm/s
E decel time: 173 msec
E/e' ratio: 5.33
FS: 36 % (ref 28–44)
IVS/LV PW RATIO, ED: 1
LA ID, A-P, ES: 39 mm
LA diam end sys: 39 mm
LA vol A4C: 57.2 ml
LA vol: 68.2 mL
LADIAMINDEX: 1.78 cm/m2
LAVOLIN: 31.1 mL/m2
LDCA: 4.91 cm2
LV E/e' medial: 5.33
LV E/e'average: 5.33
LV PW d: 14 mm — AB (ref 0.6–1.1)
LVELAT: 17.2 cm/s
LVOT SV: 119 mL
LVOT peak VTI: 0.59 cm
LVOT peak vel: 158 cm/s
LVOTD: 25 mm
Lateral S' vel: 7.29 cm/s
MV Dec: 173
MV Peak grad: 3 mmHg
MV pk A vel: 52.4 m/s
MVPKEVEL: 91.7 m/s
P 1/2 time: 51 ms
TAPSE: 13.1 mm
TDI e' lateral: 17.2
TDI e' medial: 11.3
VTI: 41.1 cm
Valve area index: 1.32

## 2016-09-03 LAB — TROPONIN I
TROPONIN I: 0.42 ng/mL — AB (ref <0.03)
Troponin I: 0.42 ng/mL (ref <0.03)

## 2016-09-03 MED ORDER — AMIODARONE HCL 200 MG PO TABS
400.0000 mg | ORAL_TABLET | Freq: Two times a day (BID) | ORAL | Status: DC
  Administered 2016-09-03 – 2016-09-04 (×3): 400 mg via ORAL
  Filled 2016-09-03 (×3): qty 2

## 2016-09-03 MED ORDER — SENNOSIDES-DOCUSATE SODIUM 8.6-50 MG PO TABS
1.0000 | ORAL_TABLET | Freq: Every day | ORAL | Status: DC
  Administered 2016-09-03 – 2016-09-05 (×4): 1 via ORAL
  Filled 2016-09-03 (×4): qty 1

## 2016-09-03 MED ORDER — RIVAROXABAN 20 MG PO TABS
20.0000 mg | ORAL_TABLET | Freq: Every day | ORAL | Status: DC
  Administered 2016-09-03 – 2016-09-06 (×4): 20 mg via ORAL
  Filled 2016-09-03 (×4): qty 1

## 2016-09-03 NOTE — Progress Notes (Signed)
Patient Name: Clinton Pearson Date of Encounter: 09/03/2016  Primary Cardiologist: Dr. Shon Millet Problem List     Active Problems:   Atrial fibrillation [facility])     Subjective   Feeling a lot better today, no chest pain or SOB  Inpatient Medications    Scheduled Meds: . cyclobenzaprine  10 mg Oral QHS  . furosemide  40 mg Oral Daily  . gabapentin  300 mg Oral TID  . metoprolol succinate  50 mg Oral Daily  . pantoprazole  40 mg Oral Daily  . rivaroxaban  20 mg Oral Q supper   Continuous Infusions: . diltiazem (CARDIZEM) infusion 12.5 mg/hr (09/03/16 0700)   PRN Meds: acetaminophen, ondansetron (ZOFRAN) IV, traMADol   Vital Signs    Vitals:   09/03/16 0600 09/03/16 0700 09/03/16 0715 09/03/16 1112  BP: 96/68 106/67    Pulse: 75 78    Resp: 13 10    Temp:   98.6 F (37 C) 98.6 F (37 C)  TempSrc:   Oral Oral  SpO2: 100% 100%      Intake/Output Summary (Last 24 hours) at 09/03/16 1153 Last data filed at 09/03/16 0700  Gross per 24 hour  Intake           101.25 ml  Output             1050 ml  Net          -948.75 ml   There were no vitals filed for this visit.  Physical Exam   GEN: Well nourished, well developed, in no acute distress.  HEENT: Grossly normal.  Neck: Supple, no JVD, carotid bruits, or masses. Cardiac: irreg irreg, no murmurs, rubs, or gallops. No clubbing, cyanosis, edema.  Radials/DP/PT 2+ and equal bilaterally.  Respiratory:  Respirations regular and unlabored, clear to auscultation bilaterally. GI: Soft, nontender, nondistended, BS + x 4. MS: no deformity or atrophy. Skin: warm and dry, no rash. Neuro:  Strength and sensation are intact. Psych: AAOx3.  Normal affect.  Labs    CBC No results for input(s): WBC, NEUTROABS, HGB, HCT, MCV, PLT in the last 72 hours. Basic Metabolic Panel  Recent Labs  09/02/16 2235  NA 139  K 4.3  CL 108  CO2 22  GLUCOSE 100*  BUN 17  CREATININE 1.19  CALCIUM 9.4   Liver Function  Tests  Recent Labs  09/02/16 2235  AST 20  ALT 21  ALKPHOS 100  BILITOT 0.7  PROT 6.1*  ALBUMIN 3.1*   No results for input(s): LIPASE, AMYLASE in the last 72 hours. Cardiac Enzymes  Recent Labs  09/02/16 2235 09/03/16 0306 09/03/16 0919  TROPONINI 0.42* 0.42* 0.42*   BNP Invalid input(s): POCBNP D-Dimer No results for input(s): DDIMER in the last 72 hours. Hemoglobin A1C No results for input(s): HGBA1C in the last 72 hours. Fasting Lipid Panel No results for input(s): CHOL, HDL, LDLCALC, TRIG, CHOLHDL, LDLDIRECT in the last 72 hours. Thyroid Function Tests  Recent Labs  09/02/16 2235  TSH 0.849    Telemetry    Aflutter with HR 80-100s - Personally Reviewed  ECG    None this admit - Personally Reviewed  Radiology    No results found.  Cardiac Studies   2D ECHO: 09/03/2016 LV EF: 55% -   60% Study Conclusions - Left ventricle: The cavity size was normal. There was moderate   concentric hypertrophy. Systolic function was normal. The   estimated ejection fraction was in the range of  55% to 60%. There   was no dynamic obstruction. Wall motion was normal; there were no   regional wall motion abnormalities. Left ventricular diastolic   function parameters were normal. - Ventricular septum: Septal motion showed paradox. These changes   are consistent with a post-thoracotomy state. - Aortic valve: A bioprosthesis was present and functioning   normally. - Mitral valve: There was mild systolic anterior motion of the   anterior leaflet. - Left atrium: The atrium was moderately dilated  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   Assessment & Plan    Afib/flutter with RVR: now well rate controlled  on Cardizem and Toprol XL. Will stop IV dilt  -- Continue Xarelto for CHADSVASC of at least 2 (HTN, age). Also had LA clipping at time of Bentall procedure. -- May need to add amiodarone to get him through post op period (next couple months). Will start amio load with 400mg  BID. Plan to taper 400mg  BID x 1 week, 200mg  BID x1 week followed by 200mg  daily. Can uptitrate metoprolol as well.  -- Can see back in office in a couple weeks and cardiovert him if he remains in a flutter  Bicuspid aortic valve with aortic root/ascending aortic aneurysm with severe AI s/p Bentall procedure: 2D ECHO this admission with stable surgical repair, normal LV function and no pericardial effusion  Elevated troponin: 0.43--> 0.43--> 0.43. Low and flat c/w demand ischemia from afib with RVR  Hx of DVT/PE: continue Xarelto  Signed, Clinton Form, PA-C  09/03/2016, 11:53 AM  Patient seen and examined and history reviewed. Agree with above findings and plan. Admitted with Atrial flutter with RVR. Still only 2 weeks s/p Bentall procedure and AVR with mechanical prosthesis. Noted to have some AFib post op. Ecg on 1/19 showed NSR. Troponin minimally elevated with flat trend. No acute ST changes on Ecg. Findings are not consistent with ACS. Echo shows normal LV function. Normal valve function. No effusion Will focus on rate control with resumption of oral metoprolol. Stop IV diltiazem. Can increase metoprolol if needed. Will start oral amiodarone load. If patient able to maintain NSR this can probably be discontinued in 2-3 months after he has healed from surgery. Anticipate DC tomorrow as long as rate controlled. Continue Xarelto.  Clinton Pearson, Clinton Pearson 09/03/2016 12:48 PM

## 2016-09-03 NOTE — Progress Notes (Signed)
MD William Dalton paged regarding patient's elevated HR sustained in the 120s-130s. Orders received to continue monitoring and page MD back in 2-3 hrs for further orders due to soft BP at the moment. Will continue to monitor.   Inis Sizer

## 2016-09-03 NOTE — Progress Notes (Signed)
  Echocardiogram 2D Echocardiogram has been performed.  Clinton Pearson 09/03/2016, 10:40 AM

## 2016-09-03 NOTE — Progress Notes (Signed)
CRITICAL VALUE ALERT  Critical value received:  Troponin 0.42  Date of notification:  09/02/2016  Time of notification: 2333  Critical value read back: Yes  Nurse who received alert:  Porfirio Oar RN  MD notified (1st page):  Fudim MD  Time of first page:  2340  MD notified (2nd page):  Time of second page:  Responding MD:  Fudim MD  Time MD responded: [phone]

## 2016-09-03 NOTE — Progress Notes (Signed)
Cardiothoracic Surgery   History reviewed. Patient is well-known to me following recent bioprosthetic Bentall procedure for bicuspid aortic valve with aortic root and ascending aortic aneurysm with severe AI.  He also had a clip placed on the LAA. Postop course only complicated by rate-controlled atrial fib and he had prior hx of occasional atrial fib in the past. He was discharged on metoprolol and Xarelto. He was seen in the office last week by our PA and doing fine with some of the usual postop complaints. He says that yesterday am he woke up with rapid heart rate and pounding in his chest with headache that did not get better so he went to [facility] ER in [place]. Found to be in atrial fib with RVR. He had no chest pain or shortness of breath. Transferred here and started on Cardizem drip with control of rate. This am he is on Cardizem at 12.5 with rhythm sinus with some irregularity in the 80's and 90's.  Subjective:  Feels fine this am.  Objective: Vital signs in last 24 hours: Temp:  [98.6 F (37 C)-99.1 F (37.3 C)] 98.6 F (37 C) (01/26 0715) Pulse Rate:  [60-121] 78 (01/26 0700) Cardiac Rhythm: Atrial fibrillation (01/26 0400) Resp:  [10-34] 10 (01/26 0700) BP: (96-117)/(62-83) 106/67 (01/26 0700) SpO2:  [97 %-100 %] 100 % (01/26 0700)  Hemodynamic parameters for last 24 hours:    Intake/Output from previous day: 01/25 0701 - 01/26 0700 In: 101.3 [I.V.:101.3] Out: 1050 [Urine:1050] Intake/Output this shift: No intake/output data recorded.  General appearance: alert and cooperative Neurologic: intact Heart: regular rate and rhythm, S1, S2 normal, no murmur, click, rub or gallop Lungs: clear to auscultation bilaterally Extremities: extremities normal, atraumatic, no cyanosis or edema Wound: incision healing well  Lab Results: No results for input(s): WBC, HGB, HCT, PLT in the last 72 hours. BMET:  Recent Labs  09/02/16 2235  NA 139  K 4.3  CL 108  CO2 22   GLUCOSE 100*  BUN 17  CREATININE 1.19  CALCIUM 9.4    PT/INR:  Recent Labs  09/02/16 2235  LABPROT 18.7*  INR 1.55   ABG    Component Value Date/Time   PHART 7.390 08/19/2016 1811   HCO3 19.6 (L) 08/19/2016 1811   TCO2 20 08/20/2016 1614   ACIDBASEDEF 5.0 (H) 08/19/2016 1811   O2SAT 100.0 08/19/2016 1811   CBG (last 3)  No results for input(s): GLUCAP in the last 72 hours.  Assessment/Plan:  Atrial fib with RVR s/p Bentall procedure and clipping of LAA. He had hx of prior atrial fib and had been on Xarelto in past for that and prior PE. 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 He had atrial fib postop but with controlled rate on Lopressor so he was never put on amiodarone. Now he has had RVR. Rate is controlled on Cardizem. Will need to decide about continued rate control vs amiodarone to try to maintain sinus. He was seen by Dr. Angelena Form preop here for cath. His troponin was positive at 0.42 but this is likely demand related. He does have some inferolateral ECG changes that are non-specific but new since prior ECG. Preop cath normal. He has had no chest pain. Will check 2D echo to assess valve, LV function and rule out pericardial effusion postop since he has been on Xarelto.  LOS: 1 day    Gaye Pollack 09/03/2016

## 2016-09-04 DIAGNOSIS — I4892 Unspecified atrial flutter: Secondary | ICD-10-CM

## 2016-09-04 MED ORDER — METOPROLOL TARTRATE 25 MG PO TABS
25.0000 mg | ORAL_TABLET | Freq: Once | ORAL | Status: AC
  Administered 2016-09-04: 25 mg via ORAL
  Filled 2016-09-04: qty 1

## 2016-09-04 MED ORDER — AMIODARONE HCL IN DEXTROSE 360-4.14 MG/200ML-% IV SOLN
30.0000 mg/h | INTRAVENOUS | Status: DC
  Administered 2016-09-04: 30 mg/h via INTRAVENOUS
  Filled 2016-09-04: qty 200

## 2016-09-04 MED ORDER — AMIODARONE LOAD VIA INFUSION
150.0000 mg | Freq: Once | INTRAVENOUS | Status: AC
  Administered 2016-09-04: 150 mg via INTRAVENOUS
  Filled 2016-09-04: qty 83.34

## 2016-09-04 MED ORDER — METOPROLOL SUCCINATE ER 25 MG PO TB24
75.0000 mg | ORAL_TABLET | Freq: Every day | ORAL | Status: DC
  Administered 2016-09-05: 75 mg via ORAL
  Filled 2016-09-04: qty 3

## 2016-09-04 MED ORDER — AMIODARONE HCL IN DEXTROSE 360-4.14 MG/200ML-% IV SOLN
60.0000 mg/h | INTRAVENOUS | Status: AC
  Filled 2016-09-04 (×2): qty 200

## 2016-09-04 NOTE — Significant Event (Signed)
Patient with atrial fibrillation developed RVR overnight. Received additional dose of metoprolol 25 mg.

## 2016-09-04 NOTE — Progress Notes (Signed)
Patient Name: Clinton Pearson Date of Encounter: 09/04/2016  Primary Cardiologist: Dr. Shon Millet Problem List     Active Problems:   Atrial fibrillation [facility])     Subjective   Denies chest pain or SOB  Inpatient Medications    Scheduled Meds: . amiodarone  400 mg Oral BID  . cyclobenzaprine  10 mg Oral QHS  . furosemide  40 mg Oral Daily  . gabapentin  300 mg Oral TID  . metoprolol succinate  50 mg Oral Daily  . pantoprazole  40 mg Oral Daily  . rivaroxaban  20 mg Oral Q supper  . senna-docusate  1 tablet Oral QHS   Continuous Infusions:  PRN Meds: acetaminophen, ondansetron (ZOFRAN) IV, traMADol   Vital Signs    Vitals:   09/04/16 0500 09/04/16 0600 09/04/16 0700 09/04/16 0906  BP: 105/73 107/71 108/72 116/80  Pulse: (!) 46 (!) 48 (!) 103 (!) 122  Resp: (!) 23 19 19    Temp:    98.4 F (36.9 C)  TempSrc:    Oral  SpO2: 99% 99% 97%     Intake/Output Summary (Last 24 hours) at 09/04/16 1008 Last data filed at 09/03/16 2200  Gross per 24 hour  Intake              240 ml  Output              500 ml  Net             -260 ml   There were no vitals filed for this visit.  Physical Exam   GEN: Well nourished, well developed, in no acute distress.  HEENT: Grossly normal.  Neck: Supple Cardiac: irreg irreg, tachycardic Respiratory:  CTA GI: Soft, nontender, nondistended MS: no deformity or atrophy. Skin: warm and dry, no rash. Neuro:  Strength and sensation are intact. Psych: AAOx3.  Normal affect.  Labs    Basic Metabolic Panel  Recent Labs  09/02/16 2235  NA 139  K 4.3  CL 108  CO2 22  GLUCOSE 100*  BUN 17  CREATININE 1.19  CALCIUM 9.4   Liver Function Tests  Recent Labs  09/02/16 2235  AST 20  ALT 21  ALKPHOS 100  BILITOT 0.7  PROT 6.1*  ALBUMIN 3.1*   Cardiac Enzymes  Recent Labs  09/02/16 2235 09/03/16 0306 09/03/16 0919  TROPONINI 0.42* 0.42* 0.42*   Thyroid Function Tests  Recent Labs  09/02/16 2235   TSH 0.849    Telemetry    Aflutter with HR 140 - Personally Reviewed   Cardiac Studies   2D ECHO: 09/03/2016 LV EF: 55% -   60% Study Conclusions - Left ventricle: The cavity size was normal. There was moderate   concentric hypertrophy. Systolic function was normal. The   estimated ejection fraction was in the range of 55% to 60%. There   was no dynamic obstruction. Wall motion was normal; there were no   regional wall motion abnormalities. Left ventricular diastolic   function parameters were normal. - Ventricular septum: Septal motion showed paradox. These changes   are consistent with a post-thoracotomy state. - Aortic valve: A bioprosthesis was present and functioning   normally. - Mitral valve: There was mild systolic anterior motion of the   anterior leaflet. - Left atrium: The atrium was moderately dilated  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   Assessment & Plan    Afib/flutter with RVR:  -- Continue Xarelto for CHADSVASC of at least 2 (HTN, age). Also had LA clipping at time of Bentall procedure. -- HR elevated this AM; SBP 115. Will increase toprol to 75 mg daily; will change to IV amiodarone for 24 hours; if rate controlled in AM, will DC on meds and fu in office in 2 weeks; if flutter persists would then proceed with DCCV.  Bicuspid aortic valve with aortic root/ascending aortic aneurysm with severe AI s/p Bentall procedure: 2D ECHO this admission with stable surgical repair, normal LV function and no pericardial effusion  Elevated troponin: 0.43--> 0.43--> 0.43. Not c/w ACS; recent cath with no CAD.  Hx of DVT/PE: continue Xarelto  Signed, Kirk Ruths, MD  09/04/2016, 10:08 AM

## 2016-09-04 NOTE — Progress Notes (Signed)
MD William Dalton paged back regarding patient's sustained elevated HR. One time order for Metoprolol 25mg  PO received. Medication administered. Will continue to monitor closely.    Clinton Pearson

## 2016-09-05 MED ORDER — METOPROLOL SUCCINATE ER 25 MG PO TB24: 75.0000 mg | ORAL_TABLET | Freq: Every day | ORAL | Status: DC

## 2016-09-05 MED ORDER — SODIUM CHLORIDE 0.9 % IV SOLN
INTRAVENOUS | Status: DC
  Administered 2016-09-05: 11:00:00 via INTRAVENOUS

## 2016-09-05 MED ORDER — AMIODARONE HCL 200 MG PO TABS
400.0000 mg | ORAL_TABLET | Freq: Two times a day (BID) | ORAL | Status: DC
  Administered 2016-09-05 – 2016-09-06 (×3): 400 mg via ORAL
  Filled 2016-09-05 (×4): qty 2

## 2016-09-05 NOTE — Progress Notes (Signed)
Patient Name: Clinton Pearson Date of Encounter: 09/05/2016  Primary Cardiologist: Dr. Shon Millet Problem List     Active Problems:   Atrial fibrillation [facility])     Subjective   Denies chest pain or SOB; mild palpitations  Inpatient Medications    Scheduled Meds: . cyclobenzaprine  10 mg Oral QHS  . furosemide  40 mg Oral Daily  . gabapentin  300 mg Oral TID  . metoprolol succinate  75 mg Oral Daily  . pantoprazole  40 mg Oral Daily  . rivaroxaban  20 mg Oral Q supper  . senna-docusate  1 tablet Oral QHS   Continuous Infusions: . amiodarone 30 mg/hr (09/04/16 2300)   PRN Meds: acetaminophen, ondansetron (ZOFRAN) IV, traMADol   Vital Signs    Vitals:   09/04/16 2100 09/05/16 0615 09/05/16 0900 09/05/16 0938  BP: (!) 104/59 102/60  103/75  Pulse:  86  (!) 140  Resp: (!) 24 18    Temp:   99.1 F (37.3 C)   TempSrc:   Oral   SpO2:  98%      Intake/Output Summary (Last 24 hours) at 09/05/16 1005 Last data filed at 09/05/16 0700  Gross per 24 hour  Intake           663.02 ml  Output             1625 ml  Net          -961.98 ml   There were no vitals filed for this visit.  Physical Exam   GEN: Well nourished, well developed, in no acute distress.  HEENT: Grossly normal.  Neck: Supple Cardiac: irreg irreg, tachycardic Respiratory:  CTA, s/p sternotomy GI: Soft, nontender, nondistended MS: no deformity or atrophy. Skin: warm and dry, no rash. Neuro:  Strength and sensation are intact. Psych: AAOx3.  Normal affect.  Labs    Basic Metabolic Panel  Recent Labs  09/02/16 2235  NA 139  K 4.3  CL 108  CO2 22  GLUCOSE 100*  BUN 17  CREATININE 1.19  CALCIUM 9.4   Liver Function Tests  Recent Labs  09/02/16 2235  AST 20  ALT 21  ALKPHOS 100  BILITOT 0.7  PROT 6.1*  ALBUMIN 3.1*   Cardiac Enzymes  Recent Labs  09/02/16 2235 09/03/16 0306 09/03/16 0919  TROPONINI 0.42* 0.42* 0.42*   Thyroid Function Tests  Recent  Labs  09/02/16 2235  TSH 0.849    Telemetry    Aflutter with HR 130-140 - Personally Reviewed   Cardiac Studies   2D ECHO: 09/03/2016 LV EF: 55% -   60% Study Conclusions - Left ventricle: The cavity size was normal. There was moderate   concentric hypertrophy. Systolic function was normal. The   estimated ejection fraction was in the range of 55% to 60%. There   was no dynamic obstruction. Wall motion was normal; there were no   regional wall motion abnormalities. Left ventricular diastolic   function parameters were normal. - Ventricular septum: Septal motion showed paradox. These changes   are consistent with a post-thoracotomy state. - Aortic valve: A bioprosthesis was present and functioning   normally. - Mitral valve: There was mild systolic anterior motion of the   anterior leaflet. - Left atrium: The atrium was moderately dilated  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   Assessment & Plan    Afib/flutter with RVR:  -- Continue Xarelto for CHADSVASC of at least 2 (HTN, age). Also had LA clipping at time of Bentall procedure. -- HR elevated this AM; SBP 110. Continue toprol; change amiodarone to po. Will try and arrange TEE/DCCV in AM if schedule allows as rate continues to be difficult to control.   Bicuspid aortic valve with aortic root/ascending aortic aneurysm with severe AI s/p Bentall procedure: 2D ECHO this admission with stable surgical repair, normal LV function and no pericardial effusion  Elevated troponin: 0.43--> 0.43--> 0.43. Not c/w ACS; recent cath with no CAD.  Hx of DVT/PE: continue Xarelto  Signed, Kirk Ruths, MD  09/05/2016, 10:05 AM

## 2016-09-06 DIAGNOSIS — I4891 Unspecified atrial fibrillation: Secondary | ICD-10-CM

## 2016-09-06 MED ORDER — COLCHICINE 0.6 MG PO TABS: 0.6000 mg | ORAL_TABLET | Freq: Two times a day (BID) | ORAL | 0 refills | Status: DC

## 2016-09-06 MED ORDER — COLCHICINE 0.6 MG PO TABS
1.2000 mg | ORAL_TABLET | Freq: Once | ORAL | Status: AC
  Administered 2016-09-06: 1.2 mg via ORAL
  Filled 2016-09-06: qty 2

## 2016-09-06 MED ORDER — COLCHICINE 0.6 MG PO TABS: 0.6000 mg | ORAL_TABLET | Freq: Two times a day (BID) | ORAL | Status: DC

## 2016-09-06 MED ORDER — AMIODARONE HCL 200 MG PO TABS: ORAL_TABLET | ORAL | 12 refills | Status: AC

## 2016-09-06 MED ORDER — METOPROLOL SUCCINATE ER 25 MG PO TB24: 75.0000 mg | ORAL_TABLET | Freq: Every day | ORAL | 12 refills | Status: DC

## 2016-09-06 NOTE — Progress Notes (Signed)
Progress Note  Patient Name: Clinton Pearson Date of Encounter: 09/06/2016  Primary Cardiologist: Angelena Form  Patient Profile     BRYIAN RICHMAN is a 66 y.o. 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   Was started on amiodarone with plans to consider TEE guided cardioversion today if he did not convert. Mild from the elevation was thought to be related to A. fib RVR and non-ACS with relatively recent cath showing no CAD.  Subjective   Feels much better none today. Notes that his heart rate had slowed down in that initially made him feel better, but now he is in sinus rhythm, notably improved. His biggest complaint is left knee pain swelling and warmth..  Inpatient Medications    Scheduled Meds: . amiodarone  400 mg Oral BID  . colchicine  0.6 mg Oral BID  . cyclobenzaprine  10 mg Oral QHS  . furosemide  40 mg Oral Daily  . gabapentin  300 mg Oral TID  . [START ON 09/07/2016] metoprolol succinate  75 mg Oral Daily  . pantoprazole  40 mg Oral Daily  . rivaroxaban  20 mg Oral Q supper  . senna-docusate  1 tablet Oral QHS   Continuous Infusions: . sodium chloride 20 mL/hr at 09/06/16 1100   PRN Meds: acetaminophen, ondansetron (ZOFRAN) IV, traMADol   Vital Signs    Vitals:   09/06/16 1229 09/06/16 1300 09/06/16 1400 09/06/16 1500  BP:  103/73 105/69 107/73  Pulse:  81 79 84  Resp:  18 15 17   Temp: 98.6 F (37 C)     TempSrc: Oral     SpO2:  100% 100% (!) 88%    Intake/Output Summary (Last 24 hours) at 09/06/16 1530 Last data filed at 09/06/16 1231  Gross per 24 hour  Intake             1330 ml  Output             1300 ml  Net               30 ml   There were no vitals filed for this visit.  Telemetry    Sinus rhythm in 80s - Personally  Reviewed  ECG    EKG  Physical Exam   GEN: No acute distress. Relatively healthy-appearing. Well-healed chest scar  Neck:  no JVD Cardiac: RRR, no murmurs, rubs, or gallops. Nondisplaced PMI Respiratory: Clear  to auscultation bilaterally. Nonlabored with good air movement GI: Soft, nontender, non-distended  MS:  no edema; mild left knee swelling with mild warmth but no erythema.. Neuro:  Nonfocal  Psych: Normal mood and affect   Labs    Chemistry  Recent Labs Lab 09/02/16 2235  NA 139  K 4.3  CL 108  CO2 22  GLUCOSE 100*  BUN 17  CREATININE 1.19  CALCIUM 9.4  PROT 6.1*  ALBUMIN 3.1*  AST 20  ALT 21  ALKPHOS 100  BILITOT 0.7  GFRNONAA >60  GFRAA >60  ANIONGAP 9     HematologyNo results for input(s): WBC, RBC, HGB, HCT, MCV, MCH, MCHC, RDW, PLT in the last 168 hours.  Cardiac Enzymes  Recent Labs Lab 09/02/16 2235 09/03/16 0306 09/03/16 0919  TROPONINI 0.42* 0.42* 0.42*   No results for input(s): TROPIPOC in  the last 168 hours.   BNP  Recent Labs Lab 09/02/16 2235  BNP 419.3*     DDimer No results for input(s): DDIMER in the last 168 hours.   Radiology    No results found.  Cardiac Studies    2-D echocardiogram 09/03/2016: EF 55-60% no regional wall motion abnormality. Occipital septal motion related to post thoracotomy. Bioprosthetic aortic valve in place functioning normally. Mild systolic anterior motion of the anterior leaflet of the mitral valve. Moderate LA dilation.  Assessment & Plan    Principal Problem:    Paroxysmal atrial fibrillation with rapid ventricular response (HCC) -Atrial fibrillation with RVR (HCC) Chemical cardioversion on oral amiodarone.   Would continue oral amiodarone loading 400 mg twice a day for 7 days, 200 mg twice a day for 7 days then 200 mg taste daily   Continue Xarelto.  Initial symptoms of heart failure mostly resolved now. He is a long-standing beta blocker dose as well as diuretic.    S/P AVR  (aortic valve replacement) well-seated on echocardiogram. He is on standing dose of oral Lasix to avoid volume overload.   Left knee pain: Somewhat warm to touch, but definitely nontoxic. Mildly swollen. This is probably arthritis versus gout. I don't want him using NSAIDs on full endocrine evaluation and therefore we will use colchicine. --would like to see him and related to an order to assess his ability to walk and have pain control prior to discharge.  All, now that he is converted to sinus rhythm, he should be to go home today. Able to really without any difficulties with Recurrence. We also need to ensure that he is able to walk with his knee.  Plan return to ambulating with the nurse know. We have given pain medications including tramadol and I have ordered colchicine.  He will likely need prescriptions for the amiodarone taper (400 mg twice a day 1 week, 20 mg twice a day 1 week, followed by 200 mg daily), as well as colchicine   He would like to try to get out of here earlier this evening that he can get home to balloon prior to snow starting later on this evening. As long as he is able to walk, I see no reason for him not to be discharge.   Signed, Glenetta Hew, MD  09/06/2016, 3:30 PM

## 2016-09-06 NOTE — Progress Notes (Signed)
  Subjective:  Reports some pain and stiffness in left knee. Hx of bilateral knee DJD.  Back in sinus rhythm this am in the 80's  Objective: Vital signs in last 24 hours: Temp:  [97.6 F (36.4 C)-99.1 F (37.3 C)] 98.6 F (37 C) (01/29 0400) Pulse Rate:  [36-140] 83 (01/29 0700) Cardiac Rhythm: Atrial fibrillation (01/29 0400) Resp:  [12-31] 23 (01/29 0700) BP: (89-113)/(60-83) 95/60 (01/29 0700) SpO2:  [86 %-100 %] 100 % (01/29 0700)  Hemodynamic parameters for last 24 hours:    Intake/Output from previous day: 01/28 0701 - 01/29 0700 In: 880 [P.O.:500; I.V.:380] Out: 750 [Urine:750] Intake/Output this shift: No intake/output data recorded.  General appearance: alert and cooperative Heart: regular rate and rhythm, S1, S2 normal, no murmur, click, rub or gallop Lungs: clear to auscultation bilaterally Extremities: some mild swelling left knee. no warmth or erythema Wound: chest incision healing well  Lab Results: No results for input(s): WBC, HGB, HCT, PLT in the last 72 hours. BMET: No results for input(s): NA, K, CL, CO2, GLUCOSE, BUN, CREATININE, CALCIUM in the last 72 hours.  PT/INR: No results for input(s): LABPROT, INR in the last 72 hours. ABG    Component Value Date/Time   PHART 7.390 08/19/2016 1811   HCO3 19.6 (L) 08/19/2016 1811   TCO2 20 08/20/2016 1614   ACIDBASEDEF 5.0 (H) 08/19/2016 1811   O2SAT 100.0 08/19/2016 1811   CBG (last 3)  No results for input(s): GLUCAP in the last 72 hours.  Assessment/Plan:  Postop atrial fib with RVR that has been difficult to control. He is back in sinus this am on po amiodarone and metoprolol. Plans per cardiology. Continue mobilization.  Left knee pain may be DJD since he has not been mobile but could be gout. He says that he thinks he has had gout in the past. May need to try some colchicine.   LOS: 4 days    Gaye Pollack 09/06/2016

## 2016-09-06 NOTE — Discharge Summary (Signed)
Discharge Summary    Patient ID: Clinton Pearson,  MRN: [id number], DOB/AGE: 68-Mar-1952 68 y.o.  Admit date: 09/02/2016 Discharge date: 09/06/2016  Primary Care Provider: Pcp Not In System Primary Cardiologist: Dr. Angelena Form   Discharge Diagnoses    Principal Problem:   Atrial fibrillation with RVR ([place]) Active Problems:   S/P AVR (aortic valve replacement)   Paroxysmal atrial fibrillation with rapid ventricular response (H[facility]) -history of postop A. fib   Allergies Allergies  Allergen Reactions  . Adhesive [Tape] Other (See Comments)    Skin irritation, and tears at skin - please use paper tape    Diagnostic Studies/Procedures    Transthoracic Echocardiography 09/03/16 Study Conclusions  - Left ventricle: The cavity size was normal. There was moderate   concentric hypertrophy. Systolic function was normal. The   estimated ejection fraction was in the range of 55% to 60%. There   was no dynamic obstruction. Wall motion was normal; there were no   regional wall motion abnormalities. Left ventricular diastolic   function parameters were normal. - Ventricular septum: Septal motion showed paradox. These changes   are consistent with a post-thoracotomy state. - Aortic valve: A bioprosthesis was present and functioning   normally. - Mitral valve: There was mild systolic anterior motion of the   anterior leaflet. - Left atrium: The atrium was moderately dilated.  _____________   History of Present Illness   Clinton Pearson is a 66 yo white male who presented from [facility] as a transfer for Afib with RVR on 09/02/16.   He was discharged on 1/14 after a Bentall procedure. He had an uncomplicated post op course. Did develop afib , which however he was known to have before. He also has known pulmonary embolism since 2016 which was felt to be related to DVT from sleeping in a recliner. He was started on Xarelto. 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  On 09/02/16, he developed paplpiatations, sweating and headache in the morning. Symptoms only worsened and he went to ED. HR in 140's. Trop + with 0.3. Dr. Oval Linsey accepted transfer. Started on dilt drip at OSH which brought the rate down to 110's.  Hospital Course     He was started on po Amiodarone for added rate control, diltiazem was discontinued. However, he continued to have rapid rates so IV amiodarone was started.  He was already on Xarelto on admission. He converted to NSR on IV Amio.   We will discharge him on po Amiodarone - 400mg  BID for 7 days, then 200mg  BID for 7 days then 200mg  daily.   This patients CHA2DS2-VASc Score and unadjusted Ischemic Stroke Rate (% per year) is equal to 3.2 % stroke rate/year from a score of 3 Above score calculated as 1 point each if present [CHF, HTN, DM, Vascular=MI/PAD/Aortic Plaque, Age if 65-74, or Male], 2 points each if present [Age > 75, or Stroke/TIA/TE]  Continue Xarelto. Will start colchicine as he had some left knee pain, with the appearance of gout.   He ambulated 600 feet with the nurse on the day of discharge and remained in NSR. He was seen today by Dr. Ellyn Hack and deemed suitable for discharge.   _____________  Discharge Vitals Blood pressure 107/73, pulse 84, temperature 98.7 F (37.1 C), temperature source Oral, resp. rate 17, SpO2 (!) 88 %.  There were no vitals filed for this visit.  Labs &  Radiologic Studies      Dg Chest 2 View  Result Date: 08/27/2016 CLINICAL DATA:  Aortic valve replacement EXAM: CHEST  2 VIEW COMPARISON:  08/21/2016 FINDINGS: Right IJ vascular sheath removed. Postop changes from aortic valve replacement and atrial appendage clip. Normal heart size and vascularity. Previous edema and basilar atelectasis resolved. Trace pleural effusions noted but have improved. No pneumothorax. Trachea is midline. Degenerative changes of the  spine. IMPRESSION: Postop changes from aortic valve replacement. Improved basilar aeration with resolving vascular congestion, basilar atelectasis, and pleural effusions. No pneumothorax. Electronically Signed   By: Jerilynn Mages.  Shick M.D.   On: 08/27/2016 14:17   Dg Chest 2 View  Result Date: 08/17/2016 CLINICAL DATA:  Thoracic aortic aneurysm. Aortic valve insufficiency. EXAM: CHEST  2 VIEW COMPARISON:  Chest CTA 08/08/2016 FINDINGS: Ascending aortic aneurysm under appreciated relative to recent CT. Normal heart size. The aorta is tortuous. There is no edema, consolidation, effusion, or pneumothorax. IMPRESSION: Stable compared to recent chest CTA.  No evidence of active disease. Electronically Signed   By: Monte Fantasia M.D.   On: 08/17/2016 14:13   Dg Chest Port 1 View  Result Date: 08/21/2016 CLINICAL DATA:  Postop aortic valve replacement EXAM: PORTABLE CHEST 1 VIEW COMPARISON:  August 20, 2016 FINDINGS: The PA catheter is has been removed. A right IJ sheath remains in place. Small bilateral effusions with underlying atelectasis. Stable cardiomediastinal silhouette. Mild pulmonary venous congestion not excluded. IMPRESSION: 1. Small bilateral pleural effusions, more prominent in the interval, with suggested pulmonary venous congestion. Electronically Signed   By: Dorise Bullion III M.D   On: 08/21/2016 07:56   Dg Chest Port 1 View  Result Date: 08/20/2016 CLINICAL DATA:  AVR.  Sore chest . EXAM: PORTABLE CHEST 1 VIEW COMPARISON:  08/19/2016 . FINDINGS: Interim extubation and removal of NG tube. Mediastinal drainage catheter and Swan-Ganz catheter in stable position. Prior cardiac valve replacement. Left atrial appendage clip noted. Stable cardiomegaly. Mild bibasilar subsegmental atelectasis. Small left pleural effusion cannot be excluded. No pneumothorax. IMPRESSION: 1. Interim extubation and removal of NG tube. Remaining lines and tubes in stable position. 2.  Prior cardiac valve replacement.  Stable  cardiomegaly. 3. Low lung volumes with mild bibasilar atelectasis . Small left pleural effusion. Electronically Signed   By: Marcello Moores  Register   On: 08/20/2016 07:32   Dg Chest Port 1 View  Result Date: 08/19/2016 CLINICAL DATA:  Status post AVR, ascending aortic replacement EXAM: PORTABLE CHEST 1 VIEW COMPARISON:  08/17/2016 FINDINGS: Interval intubation, tip of the endotracheal tube is approximately 5.6 cm superior to the carina. Interim postsurgical changes of the mediastinum with valve prosthetic noted. Placement of chest drainage catheters. Esophageal tube extends below the diaphragm but the tip is not included. Right jugular Swan-Ganz catheter tip is directed to the right and overlies the central pulmonary outflow tract. Left lower lobe atelectasis or infiltrate. Non inclusion of the left CP angle. Stable cardiomediastinal silhouette. Mild right cardiophrenic lucency is similar compared to preoperative radiograph and is therefore felt doubtful for a pneumothorax. A vertical lucency paralleling the mediastinum could relate to a small amount of postsurgical air or possibly a skin fold artifact. Mild left apical pleural opacity. IMPRESSION: 1. Support lines and tubes as described above with interval postsurgical changes of the mediastinum. 2. Left lower lobe consolidation could reflect atelectasis or infiltrate. Mild left apical pleural opacity could reflect small amount of fluid. Electronically Signed   By: Donavan Foil M.D.   On: 08/19/2016 15:08  Disposition   Pt is being discharged home today in good condition.  Follow-up Plans & Appointments    Follow-up Information    Charlie Pitter, PA-C Follow up on 09/22/2016.   Specialties:  Cardiology, Radiology Why:  at 3:30pm for hospital follow up.  Contact information: [address] [place] [address] [phone]          Discharge Instructions    Diet - low sodium heart healthy    Complete by:  As directed     Increase activity slowly    Complete by:  As directed       Discharge Medications   Current Discharge Medication List    START taking these medications   Details  amiodarone (PACERONE) 200 MG tablet Take 400mg  BID for 7 days, then 200mg  BID for 7 days, then 200mg  daily every day thereafter. Qty: 60 tablet, Refills: 12    colchicine 0.6 MG tablet Take 1 tablet (0.6 mg total) by mouth 2 (two) times daily. Qty: 30 tablet, Refills: 0      CONTINUE these medications which have CHANGED   Details  metoprolol succinate (TOPROL-XL) 25 MG 24 hr tablet Take 3 tablets (75 mg total) by mouth daily. Qty: 90 tablet, Refills: 12      CONTINUE these medications which have NOT CHANGED   Details  acetaminophen (TYLENOL) 500 MG tablet Take 1,000 mg by mouth every 6 (six) hours as needed for moderate pain or headache.     cetirizine (ZYRTEC) 10 MG tablet Take 10 mg by mouth daily.    cyclobenzaprine (FLEXERIL) 10 MG tablet Take 10 mg by mouth at bedtime.     fluticasone (FLONASE) 50 MCG/ACT nasal spray Place 2 sprays into both nostrils daily as needed for allergies.     gabapentin (NEURONTIN) 300 MG capsule Take 300 mg by mouth 3 (three) times daily.    IRON PO Take 1 tablet by mouth daily.    omeprazole (PRILOSEC) 20 MG capsule Take 20 mg by mouth daily.    rivaroxaban (XARELTO) 20 MG TABS tablet Take 1 tablet (20 mg total) by mouth daily. Qty: 30 tablet, Refills: 1    Soft Lens Products (SALINE SENSITIVE EYES) SOLN Place 1 drop into both eyes 4 (four) times daily as needed (for dry eyes).     traMADol (ULTRAM) 50 MG tablet Take 1 tablet (50 mg total) by mouth every 6 (six) hours as needed for moderate pain. Qty: 30 tablet, Refills: 0    furosemide (LASIX) 40 MG tablet Take 1 tablet (40 mg total) by mouth daily. Qty: 7 tablet, Refills: 0    potassium chloride SA (K-DUR,KLOR-CON) 20 MEQ tablet Take 1 tablet (20 mEq total) by mouth daily. Qty: 7 tablet, Refills: 0              Outstanding Labs/Studies     Duration of Discharge Encounter   Greater than 30 minutes including physician time.  Signed, Arbutus Leas NP 09/06/2016, 4:01 PM

## 2016-09-08 ENCOUNTER — Telehealth: Payer: Self-pay | Admitting: Cardiovascular Disease

## 2016-09-08 NOTE — Telephone Encounter (Signed)
I spoke with pt. He reports he is continuing to have knee pain. Colchicine has not helped.  I asked him to contact primary care to have knee evaluated. Pt agreeable with this plan.

## 2016-09-08 NOTE — Telephone Encounter (Signed)
New Message    Pt c/o medication issue:  1. Name of Medication: ccolchicine 0.6 MG tablet  2. How are you currently taking this medication (dosage and times per day)? .6mg - one tablet daily  3. Are you having a reaction (difficulty breathing--STAT)? no  4. What is your medication issue? Pt calling complaining the medication is not working and he is still experiencing pain. 7-8 pain level.  Pt was recently seen in hospital for afib and stated when he was being discharged, the doctor he spoke with told him to stop taking the medication when his pain stopped. Pt says pain has not gone away.

## 2016-09-16 ENCOUNTER — Telehealth: Payer: Self-pay | Admitting: Physician Assistant

## 2016-09-16 ENCOUNTER — Encounter: Payer: Self-pay | Admitting: Physician Assistant

## 2016-09-16 NOTE — Telephone Encounter (Signed)
I spoke with pt's wife and told her Dr. Angelena Form did not usually prescribe medication to help with sleep.  I asked her to contact primary care for this.  Wife reports concern primary care may not want to prescribe as they are not familiar with his recent medications and surgery.  I told her they could possibly contact surgeon's office to discuss as they are following him in the post op period.

## 2016-09-16 NOTE — Telephone Encounter (Signed)
New Message:    Pt is a new post hospital patient. He is having problems sleeping at night.He would like to have something for this,he only getting about 3 hours of sleep at night.

## 2016-09-21 ENCOUNTER — Other Ambulatory Visit: Payer: Self-pay | Admitting: Surgery

## 2016-09-21 ENCOUNTER — Encounter: Payer: Self-pay | Admitting: Physician Assistant

## 2016-09-21 DIAGNOSIS — I4892 Unspecified atrial flutter: Secondary | ICD-10-CM | POA: Insufficient documentation

## 2016-09-21 DIAGNOSIS — Z86718 Personal history of other venous thrombosis and embolism: Secondary | ICD-10-CM | POA: Insufficient documentation

## 2016-09-21 DIAGNOSIS — Z952 Presence of prosthetic heart valve: Secondary | ICD-10-CM

## 2016-09-21 NOTE — Progress Notes (Signed)
Cardiology Office Note    Date:  09/22/2016  ID:  [name], DOB [birth year], MRN [id number] PCP:  Pcp Not In System  Cardiologist:  Dr. Mellody Memos in [place] where he lives; Dr. Angelena Form in [place] in the interim   Chief Complaint: f/u atrial fib  History of Present Illness:  Clinton Pearson is a 66 y.o. 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   He lives in [place] with his wife. He ended up at [facility] for his heart surgery at the advice of his daughter-in-law Zackie Strait who is a PA with surgery at [facility].  He has a h/o PAF/PAFL even preceding recent surgery. In prep for surgery he'd had cath 08/11/16 showing no CAD. In 08/2016 he underwent replacement of the ascending aorta, Bentall procedure with a pericardial AVR, and left atrial clip. He had post-op AF that resolved before discharge. However, shortly after discharge he developed recurrent symptomatic atrial fib. He was seen in the [facility] ER and transferred to [facility] for further management of rapid atrial fib/flutter HR 140s. He was treated with diltiazem and amiodarone. He converted to NSR on amiodarone and was discharged on a taper. Per Dr. Doug Sou note, if he is able to maintain NSR, this could probably be discontinued in 2-3 months after he has healed from surgery. He was also treated with colchicine for gout. TSH/FT4 wnl along with LFTs except albumin 3.1. Cr 1.19. Had low flat trop elevation to 0.43 felt due to demand process. F/u echo 09/03/16 showed mod LVH, EF 0000000, normal diastolic function, mild SAM of MV, normal AV bioprosthesis, mod LAE, no effusion. Prior CBC at time of surgery showed Hgb 10.1, plt 88 (H/P does not report admission CBC at OSH but we obtained  records from [facility] indicating similar numbers).  He presents back to clinic today with his wife. He has felt well since DC. No recurrent tachypalps, SOB, chest pain. No syncope, LEE. He has struggled with insomnia and is working through this with his PCP. He is looking forward to becoming more active as the spring approaches. He is interested in participating in cardiac rehab close to home.    Past Medical History:  Diagnosis Date  . Aortic regurgitation   . Arthritis    WITH SPINAL INJURY  . Cancer ([place])    SKIN   . Depression   . Dilated aortic root ([place])   . GERD (gastroesophageal reflux disease)   . Headache    MIGRAINES   . Normal coronary arteries    a. no CAD by cath 08/2016.  Marland Kitchen PAF (paroxysmal atrial fibrillation) ([place])   . Paroxysmal atrial flutter ([place])   . Personal history of DVT (deep vein thrombosis)   . Personal history of PE (pulmonary embolism)    BILATERAL  . PONV (postoperative nausea and vomiting)   . Thoracic ascending aortic aneurysm ([place]) 03/2015   a. ascending aortic aneurysm/moderate AI s/p replacement of ascending aorta/Bentall procedure with pericardial AVR/left atrial clip 08/2016.  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    Past Surgical History:  Procedure Laterality Date  . BENTALL PROCEDURE N/A 08/19/2016   Procedure: BENTALL PROCEDURE WITH CIRC ARREST;  Surgeon: Gaye Pollack, MD;  Location: [facility];  Service: Open Heart Surgery;  Laterality:  N/A;  . CARDIAC CATHETERIZATION N/A 08/11/2016   Procedure: Right/Left Heart Cath and Coronary Angiography;  Surgeon: Burnell Blanks, MD;  Location: [facility];  Service: Cardiovascular;  Laterality: N/A;  . CLIPPING OF ATRIAL APPENDAGE N/A 08/19/2016   Procedure: CLIPPING OF ATRIAL APPENDAGE - usingAtriClip Pro 40;  Surgeon: Gaye Pollack, MD;  Location: [facility];  Service: Open Heart Surgery;  Laterality: N/A;  . ELBOW SURGERY     LEFT X3   . HAND SURGERY    . KNEE SURGERY      ARTHROSCOPY BIL  3 X EACH  . REPLACEMENT ASCENDING AORTA N/A 08/19/2016   Procedure: REPLACEMENT ASCENDING AORTA;  Surgeon: Gaye Pollack, MD;  Location: [facility];  Service: Open Heart Surgery;  Laterality: N/A;  . SHOULDER SURGERY     3 LEFT  . TEE WITHOUT CARDIOVERSION N/A 08/19/2016   Procedure: TRANSESOPHAGEAL ECHOCARDIOGRAM (TEE);  Surgeon: Gaye Pollack, MD;  Location: [facility];  Service: Open Heart Surgery;  Laterality: N/A;    Current Medications: Current Outpatient Prescriptions  Medication Sig Dispense Refill  . acetaminophen (TYLENOL) 500 MG tablet Take 1,000 mg by mouth every 6 (six) hours as needed for moderate pain or headache.     Marland Kitchen amiodarone (PACERONE) 200 MG tablet Take 400mg  BID for 7 days, then 200mg  BID for 7 days, then 200mg  daily every day thereafter. 60 tablet 12  . cetirizine (ZYRTEC) 10 MG tablet Take 10 mg by mouth daily.    . cyclobenzaprine (FLEXERIL) 10 MG tablet Take 10 mg by mouth at bedtime.     . fluticasone (FLONASE) 50 MCG/ACT nasal spray Place 2 sprays into both nostrils daily as needed for allergies.     Marland Kitchen gabapentin (NEURONTIN) 300 MG capsule Take 300 mg by mouth 3 (three) times daily.    . IRON PO Take 1 tablet by mouth daily.    . Melatonin 10 MG TABS Take 1 tablet by mouth at bedtime as needed.    . metoprolol succinate (TOPROL-XL) 25 MG 24 hr tablet Take 3 tablets (75 mg total) by mouth daily. 90 tablet 12  . omeprazole (PRILOSEC) 20 MG capsule Take 20 mg by mouth daily.    . rivaroxaban (XARELTO) 20 MG TABS tablet Take 1 tablet (20 mg total) by mouth daily. 30 tablet 1  . Soft Lens Products (SALINE SENSITIVE EYES) SOLN Place 1 drop into both eyes 4 (four) times daily as needed (for dry eyes).     . traMADol (ULTRAM) 50 MG tablet Take 1 tablet (50 mg total) by mouth every 6 (six) hours as needed for moderate pain. (Patient taking differently: Take 50-100 mg by mouth every 6 (six) hours as needed for moderate pain. ) 30 tablet 0   No current  facility-administered medications for this visit.      Allergies:   Adhesive [tape]   Social History   Social History  . Marital status: Married    Spouse name: N/A  . Number of children: N/A  . Years of education: N/A   Social History Main Topics  . Smoking status: Never Smoker  . Smokeless tobacco: Never Used  . Alcohol use 0.0 oz/week     Comment: 3-4 BEERS/WEEK  . Drug use: No  . Sexual activity: Not Asked   Other Topics Concern  . None   Social History Narrative  . None     Family History:  The patient's family history includes CVA in his father; Deep vein thrombosis  in his sister.   ROS:   Please see the history of present illness.  All other systems are reviewed and otherwise negative.    PHYSICAL EXAM:   VS:  BP 102/70 (BP Location: Right Arm, Patient Position: Sitting, Cuff Size: Normal)   Pulse 63   Ht 6\' 3"  (1.905 m)   Wt 203 lb (92.1 kg)   SpO2 99%   BMI 25.37 kg/m   BMI: Body mass index is 25.37 kg/m. GEN: Well nourished, well developed WM, in no acute distress  HEENT: normocephalic, atraumatic Neck: no JVD, carotid bruits, or masses Cardiac: RRR; no murmurs, rubs, or gallops, no edema  Respiratory:  clear to auscultation bilaterally, normal work of breathing GI: soft, nontender, nondistended, + BS MS: no deformity or atrophy  Skin: warm and dry, no rash Neuro:  Alert and Oriented x 3, Strength and sensation are intact, follows commands Psych: euthymic mood, full affect  Wt Readings from Last 3 Encounters:  09/22/16 203 lb (92.1 kg)  09/22/16 203 lb (92.1 kg)  08/27/16 198 lb (89.8 kg)      Studies/Labs Reviewed:   EKG:  EKG was ordered today and personally reviewed by me and demonstrates NSR 63bpm first degree AVb inferolateral TWI. Reviewed with Dr. Radford Pax - likely reflects LVH with repol abnormality.  Recent Labs: 08/20/2016: Magnesium 2.2 08/21/2016: Hemoglobin 10.1; Platelets 88 09/02/2016: ALT 21; B Natriuretic Peptide 419.3; BUN  17; Creatinine, Ser 1.19; Potassium 4.3; Sodium 139; TSH 0.849   Lipid Panel No results found for: CHOL, TRIG, HDL, CHOLHDL, VLDL, LDLCALC, LDLDIRECT  Additional studies/ records that were reviewed today include: Summarized above.    ASSESSMENT & PLAN:   1. Paroxysmal atrial fib/flutter - maintaining NSR on amiodarone. Per Dr. Doug Sou note, would consider discontinuing after 2-3 months after he has healed from surgery. I have recommended the patient touch base with his primary cardiologist in [employer] to be seen within the next 1-2 months to further address this. If he remains on amiodarone, further monitoring of organ systems while on amiodarone will be at discretion of primary cardiologist. Continue anticoagulation, but recheck surveillance CBC given anemia/thrombocytopenia post-operatively to ensure stability.  2. Ascending aortic aneurysm s/p Bentall with replacement of ascending aorta and pericardial AVR/left atrial clipping - doing well. The patient's wife plans to look into the cardiac rehab program locally in [place]. I told her to please let us know what paperwork they are requesting to assist with this. 3. H/o PE/DVT - remains on anticoagulation. 4. Thrombocytopenia - recheck today.  Disposition: F/u with Dr. Mellody Memos in [place] as above.   Medication Adjustments/Labs and Tests Ordered: Current medicines are reviewed at length with the patient today.  Concerns regarding medicines are outlined above. Medication changes, Labs and Tests ordered today are summarized above and listed in the Patient Instructions accessible in Encounters.   Raechel Ache PA-C  09/22/2016 3:32 PM    [facility] [place], [place], [place]  [address] Phone: [phone]; Fax: [phone]

## 2016-09-22 ENCOUNTER — Ambulatory Visit (INDEPENDENT_AMBULATORY_CARE_PROVIDER_SITE_OTHER): Payer: Medicare Other | Admitting: Physician Assistant

## 2016-09-22 ENCOUNTER — Ambulatory Visit (INDEPENDENT_AMBULATORY_CARE_PROVIDER_SITE_OTHER): Payer: Self-pay | Admitting: Surgery

## 2016-09-22 ENCOUNTER — Ambulatory Visit: Payer: Medicare Other | Admitting: Surgery

## 2016-09-22 ENCOUNTER — Encounter: Payer: Self-pay | Admitting: Physician Assistant

## 2016-09-22 ENCOUNTER — Encounter: Payer: Self-pay | Admitting: Surgery

## 2016-09-22 ENCOUNTER — Ambulatory Visit
Admission: RE | Admit: 2016-09-22 | Discharge: 2016-09-22 | Disposition: A | Discharge status: Home / Self Care | Payer: Medicare Other | Source: Ambulatory Visit | Attending: Surgery | Admitting: Surgery

## 2016-09-22 VITALS — BP 102/70 | HR 63 | Ht 75.0 in | Wt 203.0 lb

## 2016-09-22 VITALS — BP 126/76 | HR 63 | Resp 16 | Ht 75.0 in | Wt 203.0 lb

## 2016-09-22 DIAGNOSIS — I77810 Thoracic aortic ectasia: Secondary | ICD-10-CM

## 2016-09-22 DIAGNOSIS — I712 Thoracic aortic aneurysm, without rupture, unspecified: Secondary | ICD-10-CM

## 2016-09-22 DIAGNOSIS — I7121 Aneurysm of the ascending aorta, without rupture: Secondary | ICD-10-CM

## 2016-09-22 DIAGNOSIS — Z952 Presence of prosthetic heart valve: Secondary | ICD-10-CM

## 2016-09-22 DIAGNOSIS — D696 Thrombocytopenia, unspecified: Secondary | ICD-10-CM

## 2016-09-22 DIAGNOSIS — Z86718 Personal history of other venous thrombosis and embolism: Secondary | ICD-10-CM

## 2016-09-22 DIAGNOSIS — I4892 Unspecified atrial flutter: Secondary | ICD-10-CM

## 2016-09-22 DIAGNOSIS — I480 Paroxysmal atrial fibrillation: Secondary | ICD-10-CM | POA: Diagnosis not present

## 2016-09-22 DIAGNOSIS — Z9889 Other specified postprocedural states: Secondary | ICD-10-CM

## 2016-09-22 DIAGNOSIS — Z95828 Presence of other vascular implants and grafts: Secondary | ICD-10-CM

## 2016-09-22 DIAGNOSIS — Z86711 Personal history of pulmonary embolism: Secondary | ICD-10-CM

## 2016-09-22 DIAGNOSIS — I351 Nonrheumatic aortic (valve) insufficiency: Secondary | ICD-10-CM

## 2016-09-22 NOTE — Patient Instructions (Addendum)
Medication Instructions:  Your physician recommends that you continue on your current medications as directed. Please refer to the Current Medication list given to you today.   Labwork: TODAY:  CBC  Testing/Procedures: None ordered  Follow-Up: Your physician recommends that you schedule a follow-up appointment in: WITH YOUR PRIMARY CARDIOLOGIST 1-2 MONTHS  Any Other Special Instructions Will Be Listed Below (If Applicable).     If you need a refill on your cardiac medications before your next appointment, please call your pharmacy.

## 2016-09-23 LAB — CBC
HEMATOCRIT: 31.5 % — AB (ref 37.5–51.0)
HEMOGLOBIN: 10.3 g/dL — AB (ref 13.0–17.7)
MCH: 28.4 pg (ref 26.6–33.0)
MCHC: 32.7 g/dL (ref 31.5–35.7)
MCV: 87 fL (ref 79–97)
PLATELETS: 114 10*3/uL — AB (ref 150–379)
RBC: 3.63 x10E6/uL — AB (ref 4.14–5.80)
RDW: 14.6 % (ref 12.3–15.4)
WBC: 9.5 10*3/uL (ref 3.4–10.8)

## 2016-09-26 ENCOUNTER — Encounter: Payer: Self-pay | Admitting: Surgery

## 2016-09-26 NOTE — Progress Notes (Signed)
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 The patient's early postoperative recovery while in the hospital was notable for development of postoperative atrial fibrillation controlled with amiodarone. He had a history of PAF in the past with infrequent episodes. After initial discharge home he returned with atrial fib with RVR and required readmission and additional amiodarone. He was discharged on Xarelto after his surgery. Since hospital discharge the patient reports that he has been doing well. He has not really noticed any atrial fib since going back home to [place]. His only complaint today is off difficulty sleeping and his PCP started him on melatonin. He is walking without chest pain or shortness of breath.   Current Outpatient Prescriptions  Medication Sig Dispense Refill  . acetaminophen (TYLENOL) 500 MG tablet Take 1,000 mg by mouth every 6 (six) hours as needed for moderate pain or headache.     Marland Kitchen amiodarone (PACERONE) 200 MG tablet Take 400mg  BID for 7 days, then 200mg  BID for 7 days, then 200mg  daily every day thereafter. 60 tablet 12  . cetirizine (ZYRTEC) 10 MG tablet Take 10 mg by mouth daily.    . cyclobenzaprine (FLEXERIL) 10 MG tablet Take 10 mg by mouth at bedtime.     . fluticasone (FLONASE) 50 MCG/ACT nasal spray Place 2 sprays into both nostrils daily as needed for allergies.     Marland Kitchen gabapentin (NEURONTIN) 300 MG capsule Take 300 mg by mouth 3 (three) times daily.    . IRON PO Take 1 tablet by mouth daily.    . Melatonin 10 MG TABS Take 1 tablet by mouth at bedtime as needed.    . metoprolol succinate (TOPROL-XL) 25 MG 24 hr tablet Take 3 tablets (75 mg total) by mouth daily. 90 tablet 12  . omeprazole (PRILOSEC) 20 MG capsule Take 20 mg by mouth daily.    . rivaroxaban  (XARELTO) 20 MG TABS tablet Take 1 tablet (20 mg total) by mouth daily. 30 tablet 1  . Soft Lens Products (SALINE SENSITIVE EYES) SOLN Place 1 drop into both eyes 4 (four) times daily as needed (for dry eyes).     . traMADol (ULTRAM) 50 MG tablet Take 1 tablet (50 mg total) by mouth every 6 (six) hours as needed for moderate pain. (Patient taking differently: Take 50-100 mg by mouth every 6 (six) hours as needed for moderate pain. ) 30 tablet 0   No current facility-administered medications for this visit.     Physical Exam: BP 126/76 (BP Location: Right Arm, Patient Position: Sitting, Cuff Size: Large)   Pulse 63   Resp 16   Ht 6\' 3"  (1.905 m)   Wt 203 lb (92.1 kg)   SpO2 96% Comment: ON RA  BMI 25.37 kg/m   He looks well. Lung exam is clear. Cardiac exam shows a regular rate and rhythm with normal heart sounds. Chest incision is healing well and sternum is stable. There is no peripheral edema.   Diagnostic Tests:  CLINICAL DATA:  Aortic valve replacement  EXAM: CHEST  2 VIEW  COMPARISON:  08/27/2016  FINDINGS: Headache valve replacement unchanged. Left atrial clip unchanged. Heart size within normal limits. Negative for heart failure. Lungs are clear without infiltrate or effusion. Small pleural effusion previously have resolved.  IMPRESSION: No active cardiopulmonary disease.   Electronically Signed  By: Franchot Gallo M.D.   On: 09/22/2016 13:47   Impression:  Overall I think he is doing well. His atrial fibrillation appears to be controlled on amiodarone and hopefully as he gets further out from surgery he can get off this medication. He is on Xarelto. I encouraged him to continue walking. He is planning to participate in cardiac rehab closer to home in [place]. I told him he could drive his car but should not lift anything heavier than 10 lbs for three months postop.   Plan:  He is going to follow up with his PCP and cardiologist at home and will  return to see me if he has any problems with his incisions.   Gaye Pollack, MD [facility] [phone]

## 2016-10-05 ENCOUNTER — Telehealth: Payer: Self-pay | Admitting: *Deleted

## 2016-10-05 ENCOUNTER — Encounter: Payer: Self-pay | Admitting: Physician Assistant

## 2016-10-05 NOTE — Telephone Encounter (Signed)
Called pt re: email sent this morning re: EKG from o/v 09/22/16. Left a message for pt to call back.

## 2017-06-27 IMAGING — CR DG CHEST 1V PORT
1 series · 1 of 1 positions shown · non-contrast
Comparison: August 20, 2016

CLINICAL DATA: Postop aortic valve replacement

EXAM:
PORTABLE CHEST 1 VIEW

[AP]
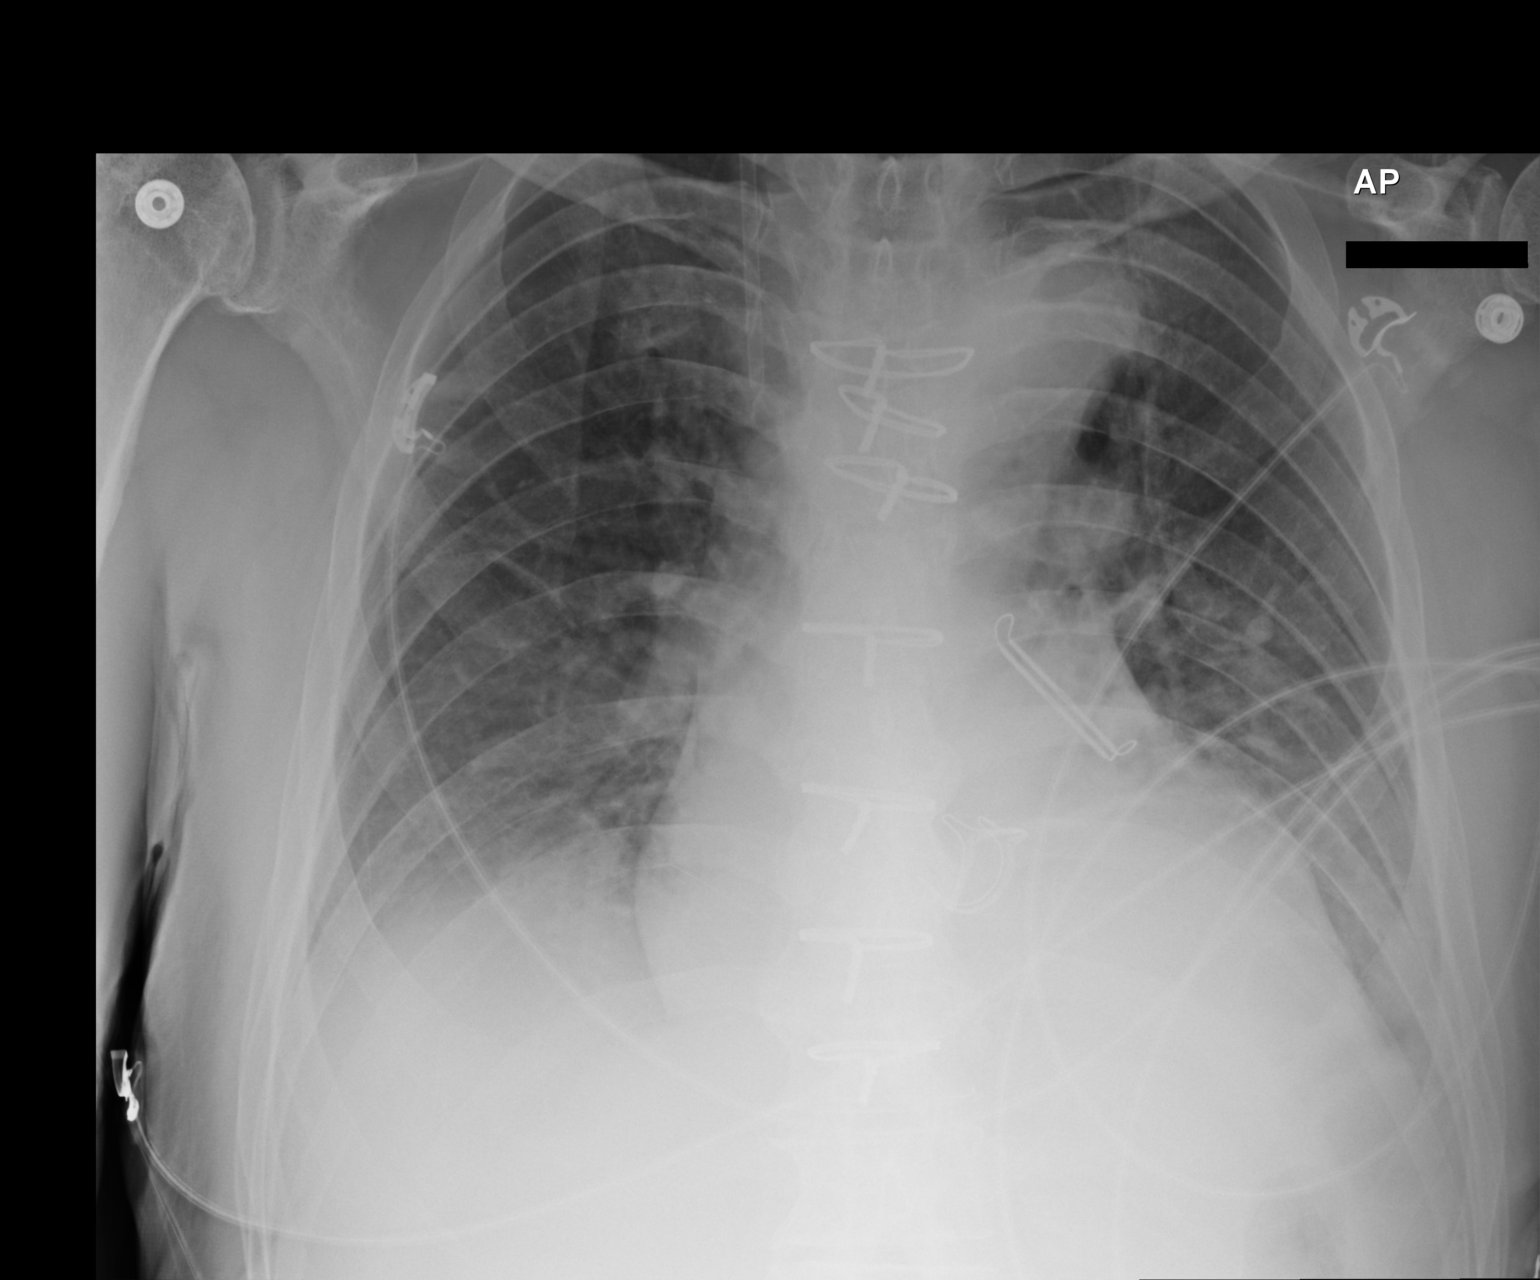

[1 of 1 positions shown; findings below may reference images not displayed]

FINDINGS: The PA catheter is has been removed. A right IJ sheath remains in
place. Small bilateral effusions with underlying atelectasis. Stable
cardiomediastinal silhouette. Mild pulmonary venous congestion not
excluded.
IMPRESSION: 1. Small bilateral pleural effusions, more prominent in the
interval, with suggested pulmonary venous congestion.

## 2017-07-03 IMAGING — DX DG CHEST 2V
2 series · 2 of 2 positions shown · non-contrast
Comparison: 08/21/2016

CLINICAL DATA: Aortic valve replacement

EXAM:
CHEST  2 VIEW

[dg chest 2 view (1 of 2)]
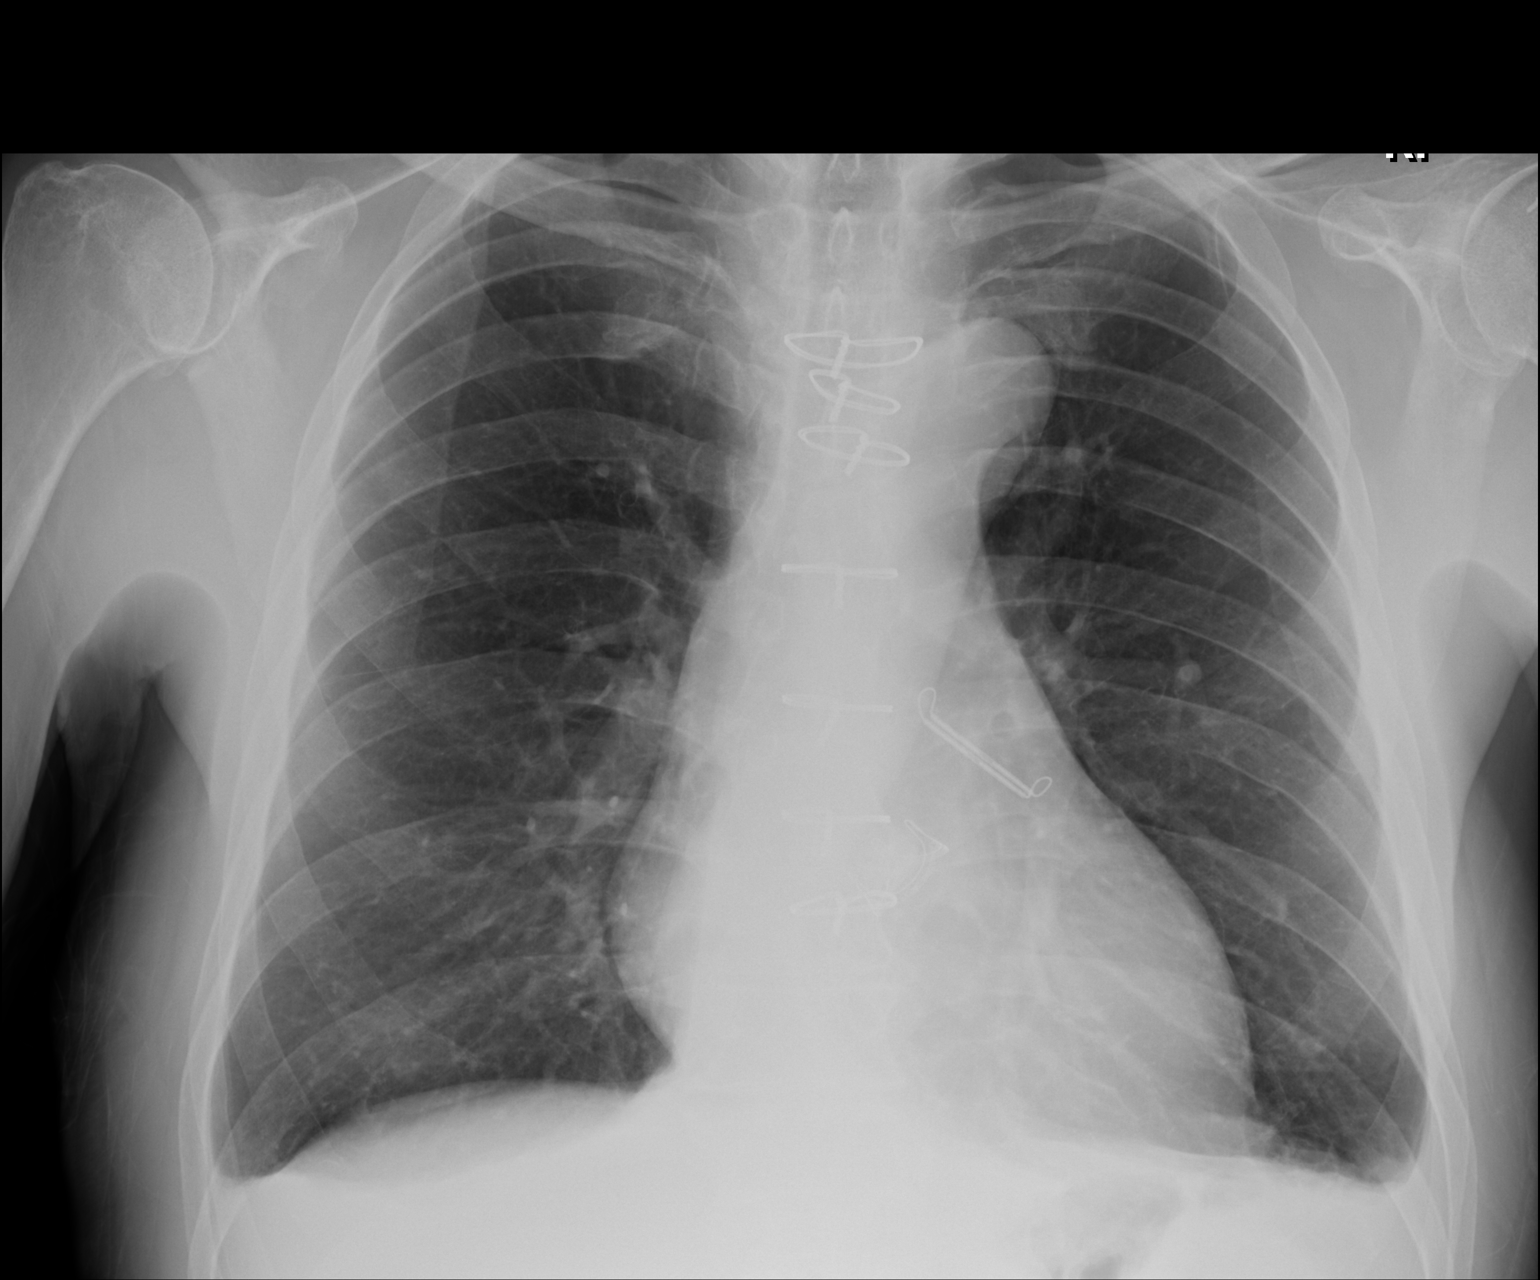

[dg chest 2 view (2 of 2)]
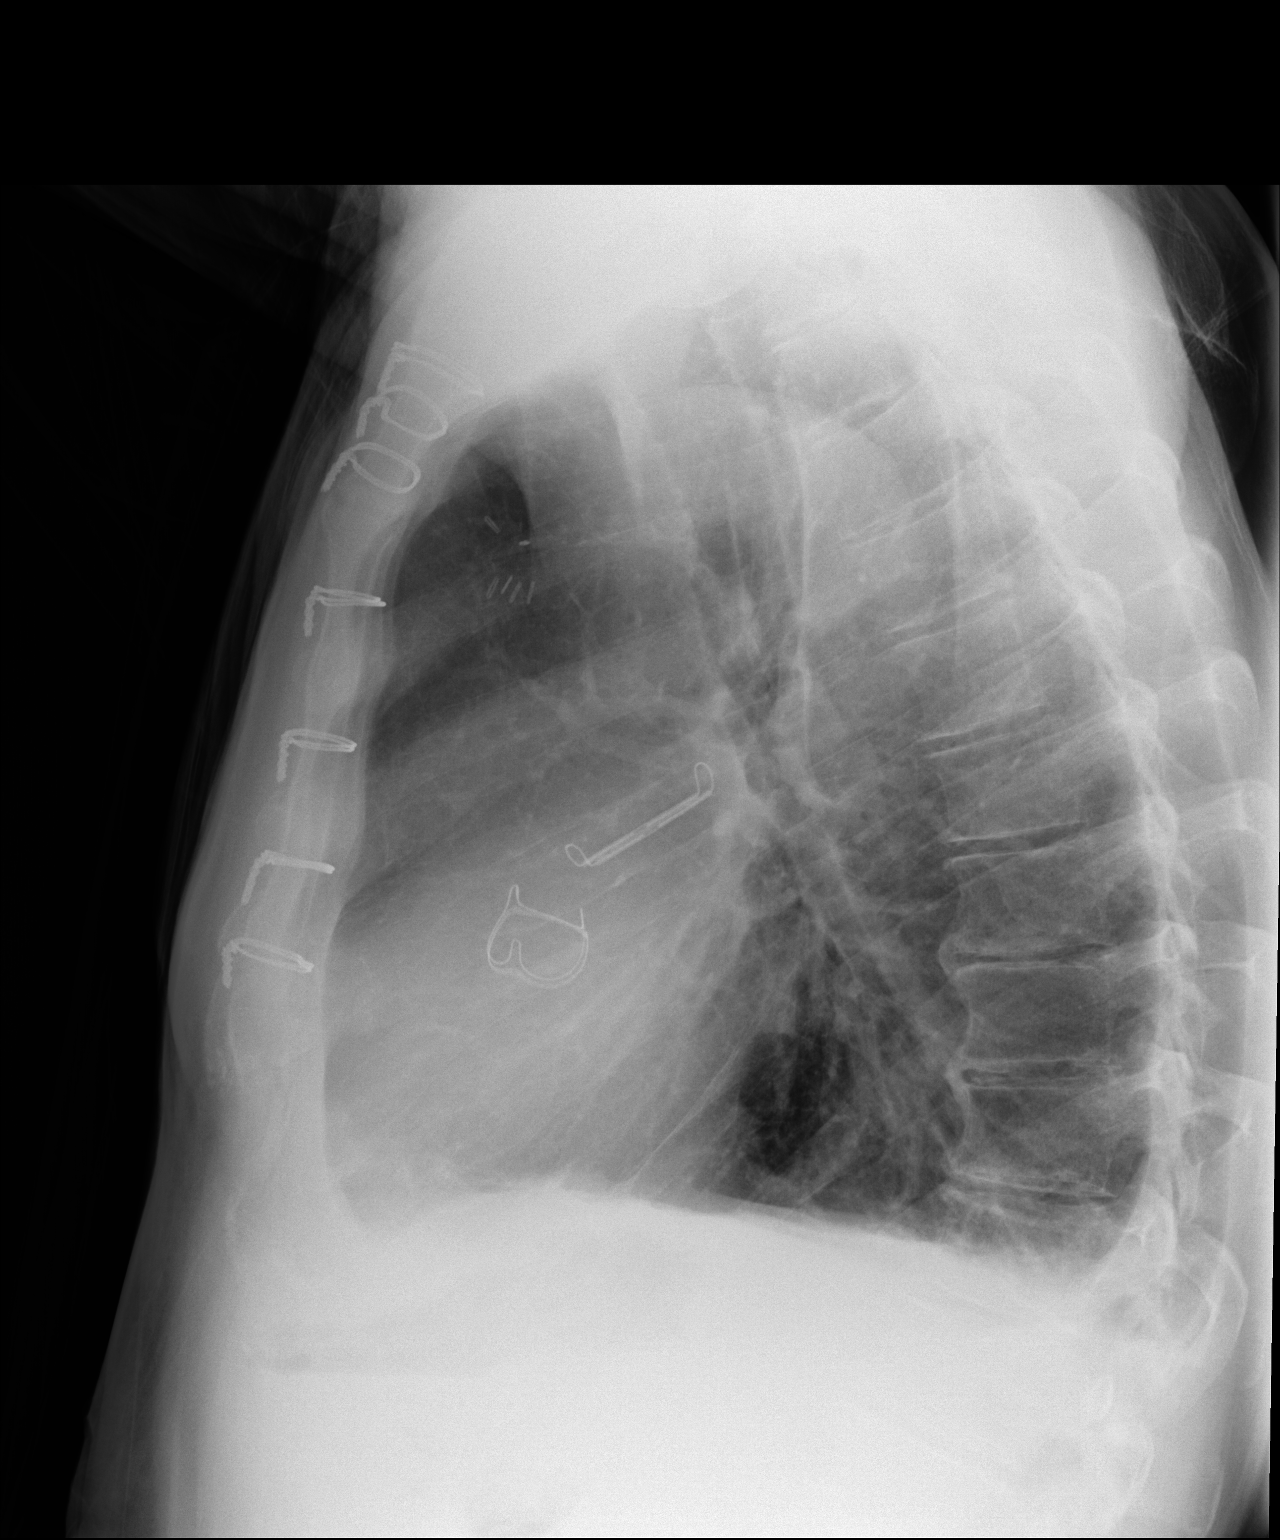

[2 of 2 positions shown; findings below may reference images not displayed]

FINDINGS: Right IJ vascular sheath removed. Postop changes from aortic valve
replacement and atrial appendage clip. Normal heart size and
vascularity. Previous edema and basilar atelectasis resolved. Trace
pleural effusions noted but have improved. No pneumothorax. Trachea
is midline. Degenerative changes of the spine.
IMPRESSION: Postop changes from aortic valve replacement.

Improved basilar aeration with resolving vascular congestion,
basilar atelectasis, and pleural effusions.

No pneumothorax.

## 2017-07-29 IMAGING — DX DG CHEST 2V
2 series · 2 of 2 positions shown · non-contrast
Comparison: 08/27/2016

CLINICAL DATA: Aortic valve replacement

EXAM:
CHEST  2 VIEW

[dg chest 2 view (1 of 2)]
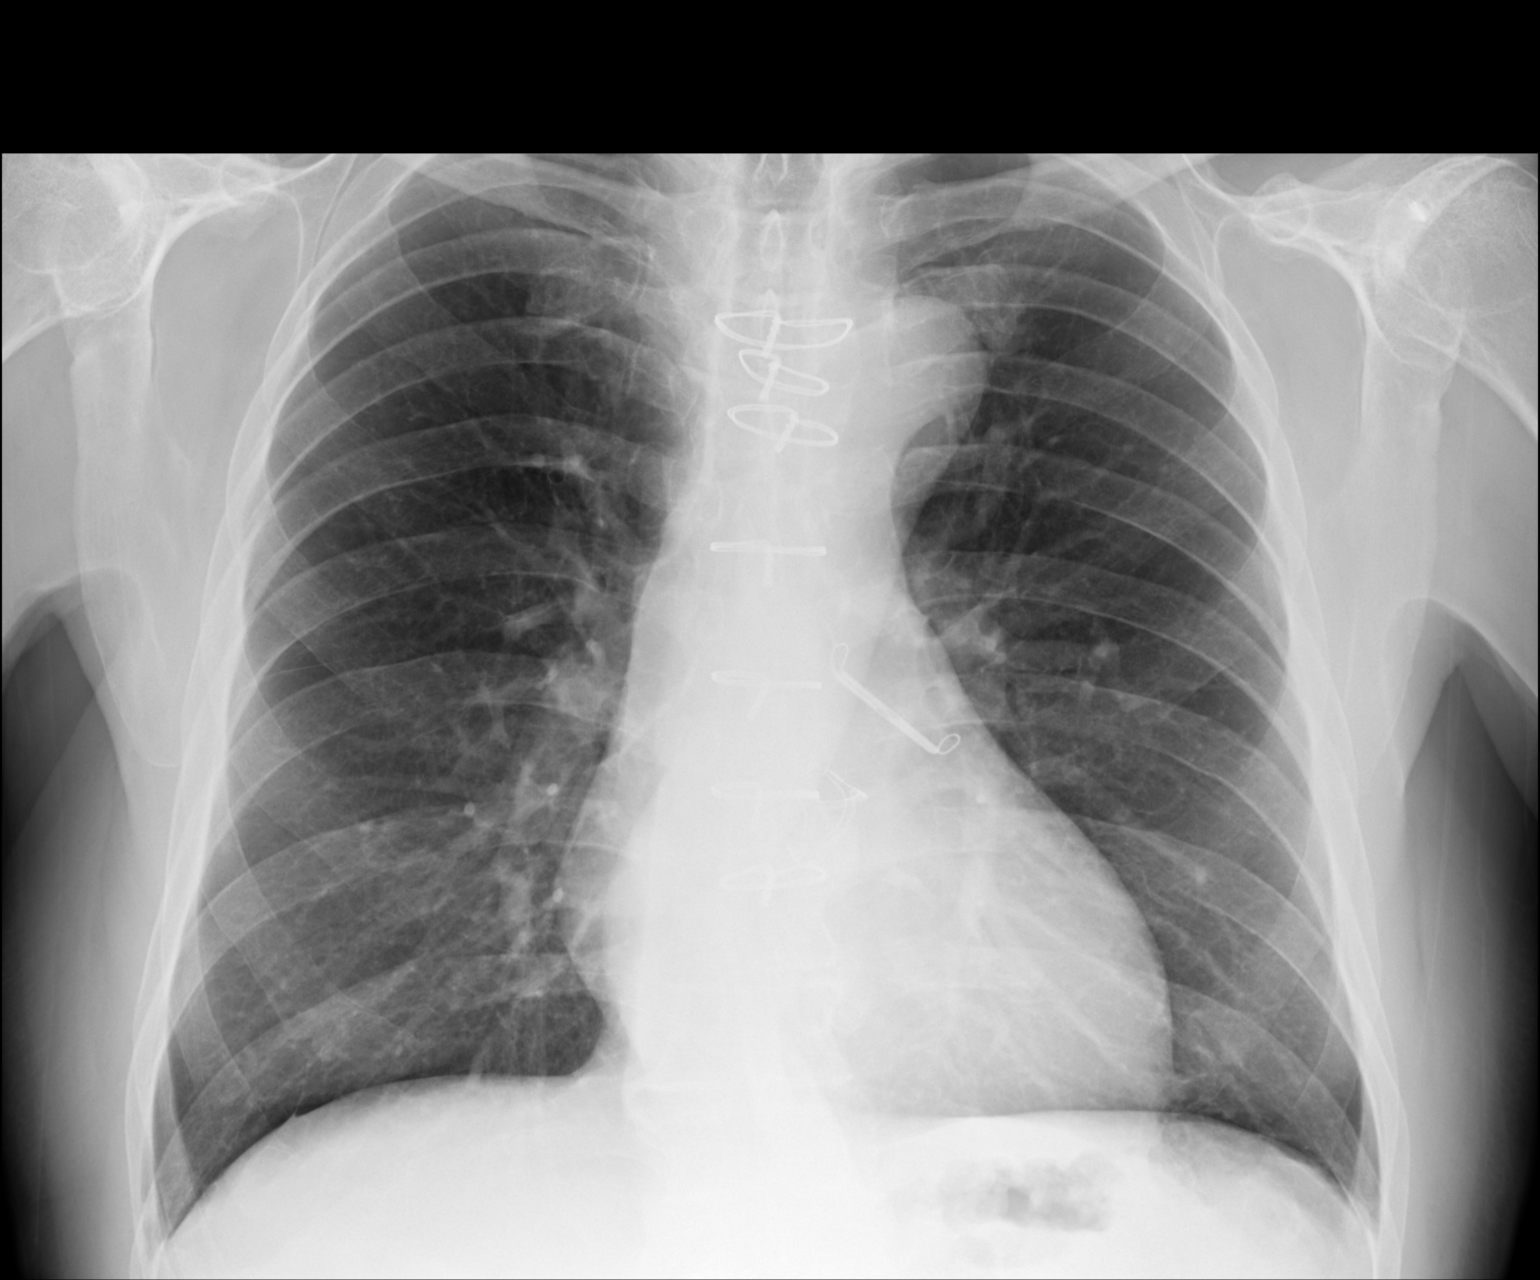

[dg chest 2 view (2 of 2)]
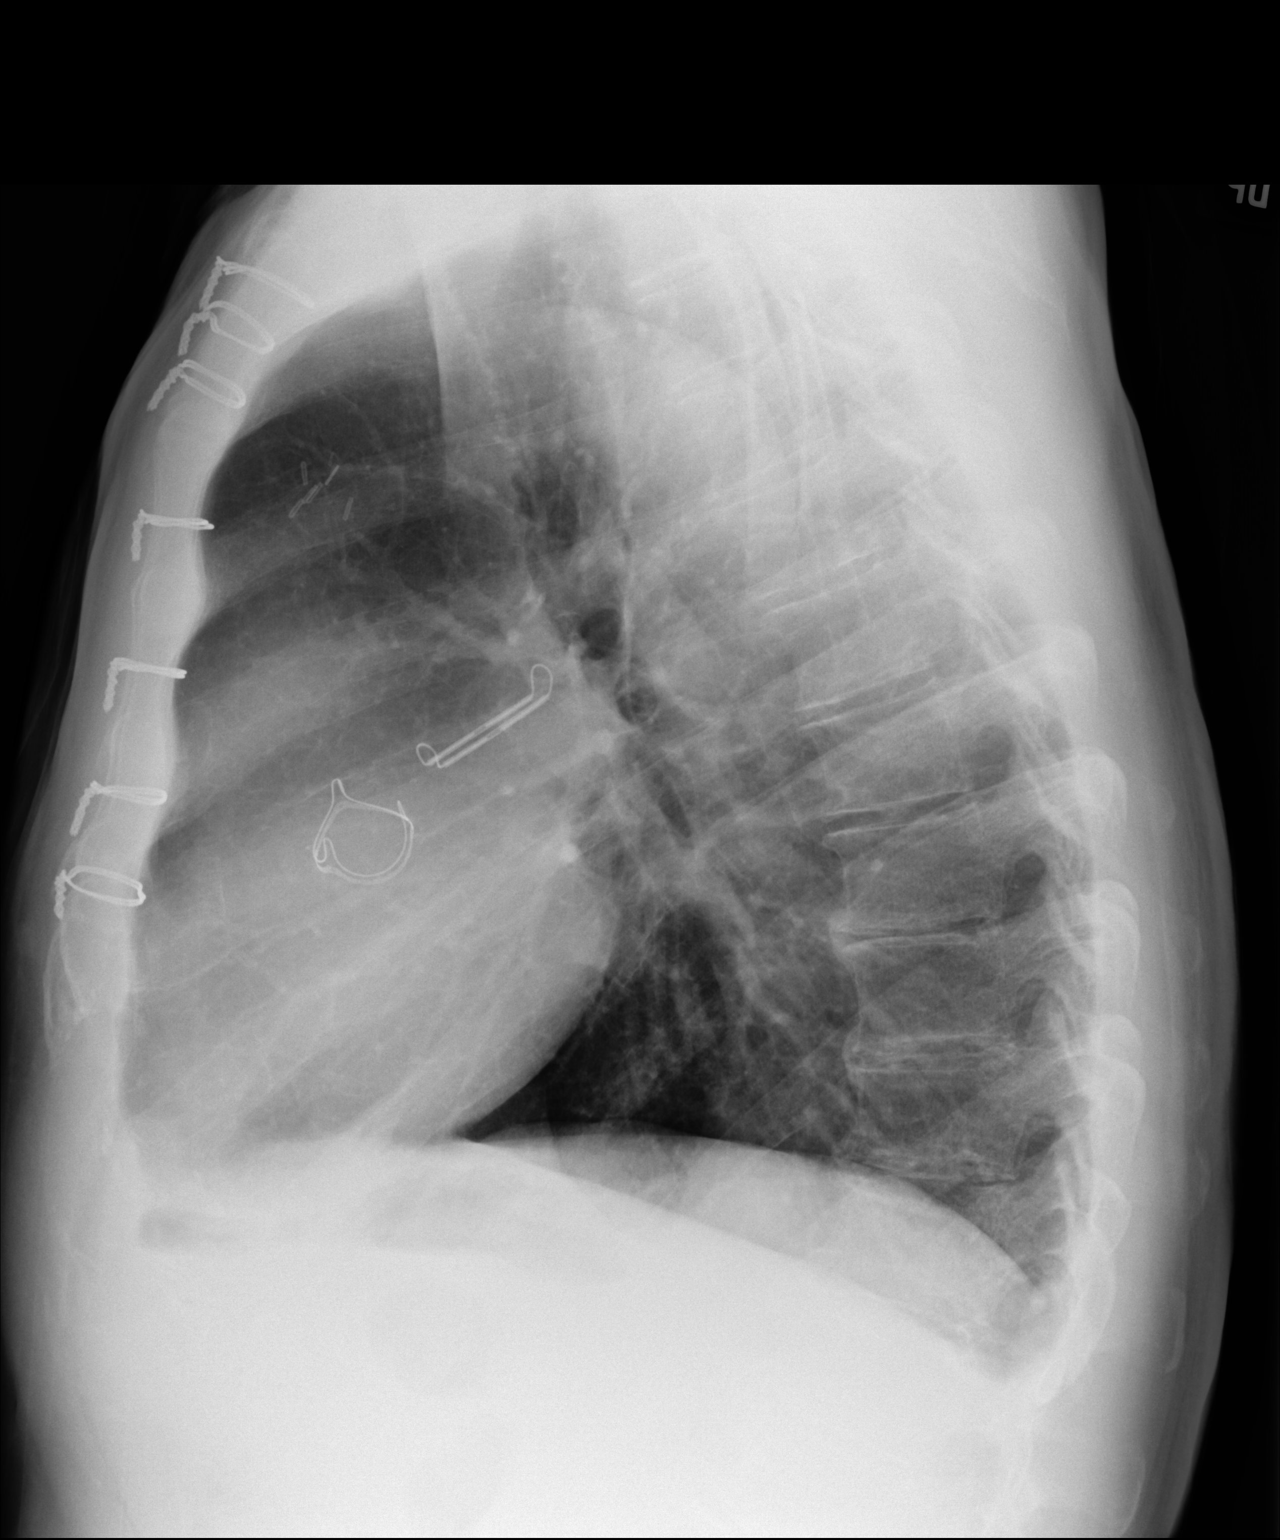

[2 of 2 positions shown; findings below may reference images not displayed]

FINDINGS: Headache valve replacement unchanged. Left atrial clip unchanged.
Heart size within normal limits. Negative for heart failure. Lungs
are clear without infiltrate or effusion. Small pleural effusion
previously have resolved.
IMPRESSION: No active cardiopulmonary disease.

## 2017-09-20 ENCOUNTER — Other Ambulatory Visit: Payer: Self-pay | Admitting: Cardiovascular Disease

## 2017-09-20 MED ORDER — METOPROLOL SUCCINATE ER 25 MG PO TB24: 75.0000 mg | ORAL_TABLET | Freq: Every day | ORAL | 0 refills | Status: AC
